# Patient Record
Sex: Male | Born: 1960 | Race: White | Hispanic: No | Marital: Married | Smoking: Never smoker
Health system: Southern US, Community
[De-identification: ages and names within clinical notes are randomized; demographics above are authoritative.]

## PROBLEM LIST (undated history)

## (undated) DIAGNOSIS — I10 Essential (primary) hypertension: Secondary | ICD-10-CM

## (undated) DIAGNOSIS — C61 Malignant neoplasm of prostate: Secondary | ICD-10-CM

## (undated) HISTORY — PX: PROSTATE BIOPSY: SHX241

## (undated) HISTORY — PX: COLONOSCOPY: SHX174

---

## 2004-11-07 ENCOUNTER — Encounter: Admission: RE | Admit: 2004-11-07 | Discharge: 2004-11-07 | Payer: Self-pay | Admitting: Geriatric Medicine

## 2004-11-17 ENCOUNTER — Encounter: Admission: RE | Admit: 2004-11-17 | Discharge: 2004-11-17 | Payer: Self-pay | Admitting: Geriatric Medicine

## 2004-11-19 ENCOUNTER — Encounter: Admission: RE | Admit: 2004-11-19 | Discharge: 2004-11-19 | Payer: Self-pay | Admitting: Geriatric Medicine

## 2004-11-26 ENCOUNTER — Ambulatory Visit (HOSPITAL_COMMUNITY): Admission: RE | Admit: 2004-11-26 | Discharge: 2004-11-26 | Payer: Self-pay | Admitting: Neurosurgery

## 2004-11-26 ENCOUNTER — Encounter (INDEPENDENT_AMBULATORY_CARE_PROVIDER_SITE_OTHER): Payer: Self-pay | Admitting: Specialist

## 2005-02-23 ENCOUNTER — Encounter: Admission: RE | Admit: 2005-02-23 | Discharge: 2005-02-23 | Payer: Self-pay | Admitting: Neurosurgery

## 2005-05-19 ENCOUNTER — Encounter: Admission: RE | Admit: 2005-05-19 | Discharge: 2005-05-19 | Payer: Self-pay | Admitting: Neurosurgery

## 2005-11-26 ENCOUNTER — Encounter: Admission: RE | Admit: 2005-11-26 | Discharge: 2005-11-26 | Payer: Self-pay | Admitting: Neurosurgery

## 2006-05-24 ENCOUNTER — Encounter: Admission: RE | Admit: 2006-05-24 | Discharge: 2006-05-24 | Payer: Self-pay | Admitting: Neurosurgery

## 2006-12-31 ENCOUNTER — Encounter: Admission: RE | Admit: 2006-12-31 | Discharge: 2006-12-31 | Payer: Self-pay | Admitting: Neurosurgery

## 2007-09-22 ENCOUNTER — Ambulatory Visit (HOSPITAL_COMMUNITY): Admission: RE | Admit: 2007-09-22 | Discharge: 2007-09-22 | Payer: Self-pay | Admitting: Geriatric Medicine

## 2007-11-29 ENCOUNTER — Encounter: Admission: RE | Admit: 2007-11-29 | Discharge: 2007-11-29 | Payer: Self-pay | Admitting: Neurosurgery

## 2008-04-12 ENCOUNTER — Ambulatory Visit (HOSPITAL_COMMUNITY): Admission: RE | Admit: 2008-04-12 | Discharge: 2008-04-12 | Payer: Self-pay | Admitting: Geriatric Medicine

## 2009-04-16 ENCOUNTER — Ambulatory Visit (HOSPITAL_COMMUNITY): Admission: RE | Admit: 2009-04-16 | Discharge: 2009-04-16 | Payer: Self-pay | Admitting: Geriatric Medicine

## 2010-04-08 ENCOUNTER — Other Ambulatory Visit: Payer: Self-pay | Admitting: Gastroenterology

## 2010-07-04 NOTE — H&P (Signed)
NAME:  Gordon Bailey, Gordon Bailey             ACCOUNT NO.:  [id number]   MEDICAL RECORD NO.:  [id number]          PATIENT TYPE:  AMB   LOCATION:  SDS                          FACILITY:  [facility]   PHYSICIAN:  Payton Doughty, M.D.      DATE OF BIRTH:  01/11/1961   DATE OF ADMISSION:  11/26/2004  DATE OF DISCHARGE:                                HISTORY & PHYSICAL   ADMITTING DIAGNOSIS:  Left skull lesion.   SERVICE:  Neurosurgery.   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 He has a lesion in his left frontal region. I saw him in my office. He  is here for a biopsy.   MEDICAL HISTORY:  Benign.   ALLERGIES:  None.   MEDICATIONS:  None.   SURGICAL HISTORY:  Basal cell carcinoma excised.   FAMILY HISTORY:  Mom is 28 with hypercholesterolemia and hypertension. Dad  died at 58 of lung cancer and hypertension.   SOCIAL HISTORY:  He does not smoke, he is a social drinker and radiologist.   REVIEW OF SYSTEMS:  Benign.   PHYSICAL EXAMINATION:  HEENT:  Within normal limits. He has a raised lesion  in the skull slightly tender and just left of the midline at the coronal  suture, probably straddling it. There is no erythema.  CARDIAC:  Unremarkable.  CHEST:  Unremarkable.  NEUROLOGIC:  He is intact.   CT and MR reveals a sclerotic lesion in the bone with lysis in the diploic  space with thickening of the underlying meninges that extends down the falx  __________ across the midline.   CLINICAL IMPRESSION:  Skull lesion. I think the dural reaction is secondary.   PLAN:  For biopsy of the skull lesion. It has been discussed with him and he  wished to proceed.           ______________________________  Payton Doughty, M.D.     MWR/MEDQ  D:  11/26/2004  T:  11/26/2004  Job:  [id number]

## 2010-07-04 NOTE — Op Note (Signed)
NAME:  PURVIS, SIDLE             ACCOUNT NO.:  [id number]   MEDICAL RECORD NO.:  [id number]          PATIENT TYPE:  AMB   LOCATION:  SDS                          FACILITY:  [facility]   PHYSICIAN:  Payton Doughty, M.D.      DATE OF BIRTH:  [birth year]   DATE OF PROCEDURE:  11/26/2004  DATE OF DISCHARGE:                                 OPERATIVE REPORT   PREOPERATIVE DIAGNOSIS:  Right frontal skull lesion.   POSTOPERATIVE DIAGNOSIS:  Right frontal skull lesion.   PROCEDURE:  Right frontal skull biopsy.   SURGEON:  Payton Doughty, M.D.   ANESTHESIA:  General endotracheal anesthesia.   PREPARATION:  Prepped with Betadine and scrubbed with alcohol wipe.   COMPLICATIONS:  None.   ASSISTANT:  Chief Strategy Officer, Basilia Jumbo.   INDICATIONS FOR PROCEDURE:  A 50 year old gentleman with a raised lesion in  the left frontal lesion of his skull.   DESCRIPTION OF PROCEDURE:  Taken to the operating room, smoothly  anesthetized and intubated.  Placed supine on the operating table.  Following shave, prep and drape in the usual sterile fashion, the skin was  infiltrated with 1% with 1:400,000 epinephrine directly over the lesion and  approximately 3 to 4 cm skin incision was made and the Weitlaner retractor  placed.  The periosteum was left intact using a 1 cm osteotome. A 1 cm  square block of bone was cut and using the curved osteotome, it was elevated  with the cortical bone attached.  The bone was quite thickened but was not  particularly bloody.  A second section slightly deeper was taken to a total  depth of approximately 4 to 5 mm.  Hemostasis was assured and successive  layers of 2-0 Vicryl and 3-0 nylon were used to close the galea and skin,  respectively.  Betadine and Telfa dressing was applied and the patient  returned to the recovery room in good condition.           ______________________________  Payton Doughty, M.D.     MWR/MEDQ  D:  11/26/2004  T:  11/26/2004  Job:  [id number]   cc:    Hal T. Stoneking, M.D.  Fax: [phone]

## 2010-11-26 ENCOUNTER — Other Ambulatory Visit: Payer: Self-pay | Admitting: Dermatology

## 2010-12-25 ENCOUNTER — Other Ambulatory Visit: Payer: Self-pay | Admitting: Neurosurgery

## 2010-12-25 DIAGNOSIS — D329 Benign neoplasm of meninges, unspecified: Secondary | ICD-10-CM

## 2011-01-16 ENCOUNTER — Other Ambulatory Visit: Payer: Self-pay

## 2011-01-28 ENCOUNTER — Ambulatory Visit
Admission: RE | Admit: 2011-01-28 | Discharge: 2011-01-28 | Disposition: A | Discharge status: Home / Self Care | Payer: PRIVATE HEALTH INSURANCE | Source: Ambulatory Visit | Attending: Neurosurgery | Admitting: Neurosurgery

## 2011-01-28 DIAGNOSIS — D329 Benign neoplasm of meninges, unspecified: Secondary | ICD-10-CM

## 2011-01-28 MED ORDER — GADOBENATE DIMEGLUMINE 529 MG/ML IV SOLN
15.0000 mL | Freq: Once | INTRAVENOUS | Status: AC | PRN
  Administered 2011-01-28: 15 mL via INTRAVENOUS

## 2011-06-10 ENCOUNTER — Other Ambulatory Visit: Payer: Self-pay | Admitting: Dermatology

## 2011-11-02 ENCOUNTER — Ambulatory Visit: Payer: PRIVATE HEALTH INSURANCE | Attending: Geriatric Medicine | Admitting: Audiology

## 2011-11-02 DIAGNOSIS — F802 Mixed receptive-expressive language disorder: Secondary | ICD-10-CM | POA: Insufficient documentation

## 2012-12-05 ENCOUNTER — Other Ambulatory Visit: Payer: Self-pay | Admitting: Dermatology

## 2013-06-15 ENCOUNTER — Other Ambulatory Visit: Payer: Self-pay | Admitting: Geriatric Medicine

## 2013-06-15 ENCOUNTER — Ambulatory Visit
Admission: RE | Admit: 2013-06-15 | Discharge: 2013-06-15 | Disposition: A | Discharge status: Home / Self Care | Payer: PRIVATE HEALTH INSURANCE | Source: Ambulatory Visit | Attending: Geriatric Medicine | Admitting: Geriatric Medicine

## 2013-06-15 DIAGNOSIS — M549 Dorsalgia, unspecified: Secondary | ICD-10-CM

## 2013-12-13 ENCOUNTER — Other Ambulatory Visit: Payer: Self-pay | Admitting: Dermatology

## 2014-06-20 ENCOUNTER — Other Ambulatory Visit: Payer: Self-pay | Admitting: Geriatric Medicine

## 2014-06-20 DIAGNOSIS — M545 Low back pain: Secondary | ICD-10-CM

## 2014-06-24 ENCOUNTER — Ambulatory Visit
Admission: RE | Admit: 2014-06-24 | Discharge: 2014-06-24 | Disposition: A | Discharge status: Home / Self Care | Payer: PRIVATE HEALTH INSURANCE | Source: Ambulatory Visit | Attending: Geriatric Medicine | Admitting: Geriatric Medicine

## 2014-06-24 DIAGNOSIS — M545 Low back pain: Secondary | ICD-10-CM

## 2014-06-25 ENCOUNTER — Ambulatory Visit
Admission: RE | Admit: 2014-06-25 | Discharge: 2014-06-25 | Disposition: A | Discharge status: Home / Self Care | Payer: PRIVATE HEALTH INSURANCE | Source: Ambulatory Visit | Attending: Geriatric Medicine | Admitting: Geriatric Medicine

## 2014-06-25 ENCOUNTER — Other Ambulatory Visit: Payer: Self-pay | Admitting: Geriatric Medicine

## 2014-06-25 DIAGNOSIS — M545 Low back pain, unspecified: Secondary | ICD-10-CM

## 2014-06-25 DIAGNOSIS — G8929 Other chronic pain: Secondary | ICD-10-CM

## 2014-06-25 MED ORDER — IOHEXOL 180 MG/ML  SOLN
1.0000 mL | Freq: Once | INTRAMUSCULAR | Status: AC | PRN
  Administered 2014-06-25: 1 mL via EPIDURAL

## 2014-06-25 MED ORDER — METHYLPREDNISOLONE ACETATE 40 MG/ML INJ SUSP (RADIOLOG
120.0000 mg | Freq: Once | INTRAMUSCULAR | Status: AC
  Administered 2014-06-25: 120 mg via EPIDURAL

## 2014-06-25 NOTE — Discharge Instructions (Signed)

## 2014-07-02 ENCOUNTER — Other Ambulatory Visit: Payer: Self-pay | Admitting: Geriatric Medicine

## 2014-07-02 DIAGNOSIS — M545 Low back pain, unspecified: Secondary | ICD-10-CM

## 2014-07-02 DIAGNOSIS — G8929 Other chronic pain: Secondary | ICD-10-CM

## 2014-07-10 ENCOUNTER — Ambulatory Visit
Admission: RE | Admit: 2014-07-10 | Discharge: 2014-07-10 | Disposition: A | Discharge status: Home / Self Care | Payer: PRIVATE HEALTH INSURANCE | Source: Ambulatory Visit | Attending: Geriatric Medicine | Admitting: Geriatric Medicine

## 2014-07-10 DIAGNOSIS — G8929 Other chronic pain: Secondary | ICD-10-CM

## 2014-07-10 DIAGNOSIS — M545 Low back pain: Principal | ICD-10-CM

## 2014-07-10 MED ORDER — METHYLPREDNISOLONE ACETATE 40 MG/ML INJ SUSP (RADIOLOG
120.0000 mg | Freq: Once | INTRAMUSCULAR | Status: AC
  Administered 2014-07-10: 120 mg via EPIDURAL

## 2014-07-10 MED ORDER — IOHEXOL 180 MG/ML  SOLN
1.0000 mL | Freq: Once | INTRAMUSCULAR | Status: AC | PRN
  Administered 2014-07-10: 1 mL via EPIDURAL

## 2015-01-03 ENCOUNTER — Ambulatory Visit
Admission: RE | Admit: 2015-01-03 | Discharge: 2015-01-03 | Disposition: A | Discharge status: Home / Self Care | Payer: PRIVATE HEALTH INSURANCE | Source: Ambulatory Visit | Attending: Geriatric Medicine | Admitting: Geriatric Medicine

## 2015-01-03 ENCOUNTER — Other Ambulatory Visit: Payer: Self-pay | Admitting: Geriatric Medicine

## 2015-01-03 DIAGNOSIS — M25512 Pain in left shoulder: Secondary | ICD-10-CM

## 2015-06-25 ENCOUNTER — Other Ambulatory Visit: Payer: Self-pay | Admitting: Gastroenterology

## 2017-06-15 ENCOUNTER — Telehealth: Payer: Self-pay | Admitting: Medical Oncology

## 2017-06-15 NOTE — Telephone Encounter (Signed)
Left a voicemail requesting a return call regarding referral to the [facility].

## 2017-06-15 NOTE — Telephone Encounter (Signed)
Dr. Purcell Nails returned call and I introduced myself as the Prostate Nurse Navigator and the Coordinator of the [facility] [place].  1. I confirmed with the patient he is aware of his referral to the clinic 06/25/17 arriving at 8:30 am.  2. I discussed the format of the clinic and the physicians he will be seeing that day.  3. I discussed where the clinic is located and how to contact me.  4. I confirmed his address and informed him I would be mailing a packet of information and forms to be completed. I asked him to bring them with him the day of his appointment.   He voiced understanding of the above. I asked him to call me if he has any questions or concerns regarding his appointments or the forms he needs to complete.

## 2017-06-24 ENCOUNTER — Telehealth: Payer: Self-pay | Admitting: Medical Oncology

## 2017-06-24 NOTE — Progress Notes (Signed)
GU Location of Tumor / Histology: prostatic adenocarcinoma  If Prostate Cancer, Gleason Score is (3+4) and PSA is (4.61). 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 Alinda Money by Dr. Felipa Eth for further evaluation of a rising PSA.   Biopsies of prostate (if applicable) revealed:    Past/Anticipated interventions by urology, if any: prostate biopsy, referral to [facility]  Past/Anticipated interventions by medical oncology, if any: no  Weight changes, if any: no  Bowel/Bladder complaints, if any: IPSS 4.   Nausea/Vomiting, if any: no  Pain issues, if any:  no  SAFETY ISSUES:  Prior radiation? no  Pacemaker/ICD? no  Possible current pregnancy? no  Is the patient on methotrexate? no  Current Complaints / other details:  57 year old male. Married with three daughters. Father with hx of lung ca.

## 2017-06-24 NOTE — Telephone Encounter (Signed)
Called patient and spoke with his wife to confirm appointment for [facility] arriving at 8:00am. I reviewed registration and reminded them to bring completed medical forms. She voiced understanding.

## 2017-06-25 ENCOUNTER — Inpatient Hospital Stay: Payer: PRIVATE HEALTH INSURANCE | Attending: Oncology | Admitting: Oncology

## 2017-06-25 ENCOUNTER — Encounter: Payer: Self-pay | Admitting: General Practice

## 2017-06-25 ENCOUNTER — Encounter: Payer: Self-pay | Admitting: Radiation Oncology

## 2017-06-25 ENCOUNTER — Ambulatory Visit
Admission: RE | Admit: 2017-06-25 | Discharge: 2017-06-25 | Disposition: A | Discharge status: Home / Self Care | Payer: PRIVATE HEALTH INSURANCE | Source: Ambulatory Visit | Attending: Radiation Oncology | Admitting: Radiation Oncology

## 2017-06-25 ENCOUNTER — Other Ambulatory Visit: Payer: Self-pay

## 2017-06-25 ENCOUNTER — Encounter: Payer: Self-pay | Admitting: Medical Oncology

## 2017-06-25 VITALS — BP 123/72 | HR 91 | Temp 98.2°F | Resp 20 | Ht 68.0 in | Wt 167.0 lb

## 2017-06-25 DIAGNOSIS — Z801 Family history of malignant neoplasm of trachea, bronchus and lung: Secondary | ICD-10-CM | POA: Diagnosis not present

## 2017-06-25 DIAGNOSIS — Z806 Family history of leukemia: Secondary | ICD-10-CM | POA: Diagnosis not present

## 2017-06-25 DIAGNOSIS — E785 Hyperlipidemia, unspecified: Secondary | ICD-10-CM | POA: Insufficient documentation

## 2017-06-25 DIAGNOSIS — R972 Elevated prostate specific antigen [PSA]: Secondary | ICD-10-CM | POA: Diagnosis not present

## 2017-06-25 DIAGNOSIS — Z79899 Other long term (current) drug therapy: Secondary | ICD-10-CM | POA: Insufficient documentation

## 2017-06-25 DIAGNOSIS — Z800 Family history of malignant neoplasm of digestive organs: Secondary | ICD-10-CM | POA: Diagnosis not present

## 2017-06-25 DIAGNOSIS — I10 Essential (primary) hypertension: Secondary | ICD-10-CM | POA: Insufficient documentation

## 2017-06-25 DIAGNOSIS — C61 Malignant neoplasm of prostate: Secondary | ICD-10-CM | POA: Diagnosis present

## 2017-06-25 HISTORY — DX: Malignant neoplasm of prostate: C61

## 2017-06-25 NOTE — Progress Notes (Signed)
                               Care Plan Summary  Name: Gordon Bailey DOB: 1960/07/12   Your Medical Team:   Urologist -  Dr. Raynelle Bring, [facility]  Radiation Oncologist - Dr. Tyler Pita, [facility]   Medical Oncologist - Dr. Zola Button, [place]  Recommendations: 1) Robotic prostatectomy 2) Radiation  3) Brachytherapy  * These recommendations are based on information available as of today's consult.      Recommendations may change depending on the results of further tests or exams.  Next Steps: 1) Dr. Lynne Logan office will schedule surgery   When appointments need to be scheduled, you will be contacted by [facility] and/or [facility].  Patient provided with business cards for all team members and a copy of "Fall Prevention Patient Safety Sheet". Questions?  Please do not hesitate to call Cira Rue, RN, BSN, OCN at [phone] any questions or concerns.  Gordon Bailey is your Oncology Nurse Navigator and is available to assist you while you're receiving your medical care at [facility].

## 2017-06-25 NOTE — Progress Notes (Signed)
Radiation Oncology         [phone] ________________________________  Initial outpatient Consultation  Name: Gordon Bailey MRN: [id number]  Date: 06/25/2017  DOB: 1960/10/06  LH:[id number], No Pcp Per  Raynelle Bring, MD   REFERRING PHYSICIAN: Raynelle Bring, MD  DIAGNOSIS: 57 year-old gentleman with Stage T1c adenocarcinoma of the prostate with Gleason Score of 3+4, and PSA of 4.61.    ICD-10-CM   1. Malignant neoplasm of prostate ([place]) C61     HISTORY OF PRESENT ILLNESS: Gordon Bailey is a 57 y.o. male with a diagnosis of prostate cancer. He was noted to have a gradual rise in his PSA since 2014.  Recently, his PSA was noted further elevated at 4.61 by his primary care physician, Dr. Felipa Eth.  Accordingly, he was referred for evaluation in urology by Dr. Alinda Money on 04/28/2017,  digital rectal examination was performed at that time revealing no nodularity.  The patient proceeded to transrectal ultrasound with 12 biopsies of the prostate on 05/25/2017.  The prostate volume measured 25.7 cc.  Out of 12 core biopsies, 3 were positive.  The maximum Gleason score was 3+4, and this was seen in the right base lateral. Additionally, there was Gleason 3+3 in the left base lateral and right mid lateral.  The patient reviewed the biopsy results with his urologist and he has kindly been referred today for discussion of potential radiation treatment options.      PREVIOUS RADIATION THERAPY: No  PAST MEDICAL HISTORY:  Past Medical History:  Diagnosis Date  . Prostate cancer ([place])       PAST SURGICAL HISTORY: Past Surgical History:  Procedure Laterality Date  . PROSTATE BIOPSY      FAMILY HISTORY:  Family History  Problem Relation Age of Onset  . Lung cancer Father   . Pancreatic cancer Maternal Aunt   . Leukemia Maternal Uncle   . Head & neck cancer Maternal Grandfather     SOCIAL HISTORY:  Social History   Socioeconomic History  . Marital status: Married    Spouse  name: Not on file  . Number of children: Not on file  . Years of education: Not on file  . Highest education level: Not on file  Occupational History  . Not on file  Social Needs  . Financial resource strain: Not on file  . Food insecurity:    Worry: Not on file    Inability: Not on file  . Transportation needs:    Medical: Not on file    Non-medical: Not on file  Tobacco Use  . Smoking status: Never Smoker  . Smokeless tobacco: Never Used  Substance and Sexual Activity  . Alcohol use: Yes    Alcohol/week: 3.0 oz    Types: 5 Standard drinks or equivalent per week  . Drug use: Not Currently  . Sexual activity: Yes  Lifestyle  . Physical activity:    Days per week: Not on file    Minutes per session: Not on file  . Stress: Not on file  Relationships  . Social connections:    Talks on phone: Not on file    Gets together: Not on file    Attends religious service: Not on file    Active member of club or organization: Not on file    Attends meetings of clubs or organizations: Not on file    Relationship status: Not on file  . Intimate partner violence:    Fear of current or ex partner: Not on  file    Emotionally abused: Not on file    Physically abused: Not on file    Forced sexual activity: Not on file  Other Topics Concern  . Not on file  Social History Narrative  . Not on file    ALLERGIES: Patient has no known allergies.  MEDICATIONS:  Current Outpatient Medications  Medication Sig Dispense Refill  . amLODipine (NORVASC) 2.5 MG tablet     . amphetamine-dextroamphetamine (ADDERALL) 10 MG tablet     . benazepril (LOTENSIN) 20 MG tablet     . simvastatin (ZOCOR) 10 MG tablet      No current facility-administered medications for this encounter.     REVIEW OF SYSTEMS:  On review of systems, the patient reports that he is doing well overall. He denies any chest pain, shortness of breath, cough, fevers, chills, night sweats, unintended weight changes. He denies any  bowel disturbances, and denies abdominal pain, nausea or vomiting. He denies any new musculoskeletal or joint aches or pains. Denies any feet or ankle swelling. His IPSS was 4, indicating mild urinary symptoms. He is able to complete sexual activity with all attempts. A complete review of systems is obtained and is otherwise negative.    PHYSICAL EXAM:  Wt Readings from Last 3 Encounters:  06/25/17 167 lb (75.8 kg)   Temp Readings from Last 3 Encounters:  06/25/17 98.2 F (36.8 C) (Oral)   BP Readings from Last 3 Encounters:  06/25/17 123/72  07/10/14 (!) 148/80  06/25/14 136/65   Pulse Readings from Last 3 Encounters:  06/25/17 91  07/10/14 76  06/25/14 63   Pain Assessment Pain Score: 0-No pain/10  In general this is a well appearing caucasian male in no acute distress. The patient is accompanied by his wife today. He is alert and oriented x4 and appropriate throughout the examination. HEENT reveals that the patient is normocephalic, atraumatic. EOMs are intact. PERRLA. Skin is intact without any evidence of gross lesions. Cardiovascular exam reveals a regular rate and rhythm, no clicks rubs or murmurs are auscultated. Chest is clear to auscultation bilaterally. Lymphatic assessment is performed and does not reveal any adenopathy in the cervical, supraclavicular, axillary, or inguinal chains. Abdomen has active bowel sounds in all quadrants and is intact. The abdomen is soft, non tender, non distended. Lower extremities are negative for pretibial pitting edema, deep calf tenderness, cyanosis or clubbing.   KPS = 100  100 - Normal; no complaints; no evidence of disease. 90   - Able to carry on normal activity; minor signs or symptoms of disease. 80   - Normal activity with effort; some signs or symptoms of disease. 22   - Cares for self; unable to carry on normal activity or to do active work. 60   - Requires occasional assistance, but is able to care for most of his personal  needs. 50   - Requires considerable assistance and frequent medical care. 34   - Disabled; requires special care and assistance. 56   - Severely disabled; hospital admission is indicated although death not imminent. 2   - Very sick; hospital admission necessary; active supportive treatment necessary. 10   - Moribund; fatal processes progressing rapidly. 0     - Dead  Karnofsky DA, Abelmann WH, Craver LS and Burchenal JH [phone]) The use of the nitrogen mustards in the palliative treatment of carcinoma: with particular reference to bronchogenic carcinoma Cancer 1 [id number]  LABORATORY DATA:  No results found for: WBC, HGB, HCT, MCV,  PLT No results found for: NA, K, CL, CO2 No results found for: ALT, AST, GGT, ALKPHOS, BILITOT   RADIOGRAPHY: No results found.    IMPRESSION/PLAN: 1. 57 y.o. gentleman with Stage T1c adenocarcinoma of the prostate with Gleason Score of 3+4, and PSA of 4.61.  Today we reviewed the findings and workup thus far.  We discussed the natural history of prostate cancer.  We reviewed the the implications of T-stage, Gleason's Score, and PSA on decision-making and outcomes in prostate cancer.  We discussed radiation treatment in the management of prostate cancer with regard to the logistics and delivery of external beam radiation treatment as well as the logistics and delivery of prostate brachytherapy.  We compared and contrasted each of these approaches and also compared these against prostatectomy.   At the conclusion of our conversation, the patient elects to proceed with prostatectomy.  We will share our findings with Dr. Alinda Money. We would be happy to continue to participate in his care if clinically indicated in the future.    Nicholos Johns, PA-C    Tyler Pita, MD  [facility] Direct Dial: [phone]  Fax: [phone] [email or web address]  Skype  [email or web address]  This document serves as a record of services personally performed by Tyler Pita, MD and Freeman Caldron, PA-C. It was created on their behalf by Arlyce Harman, a trained medical scribe. The creation of this record is based on the scribe's personal observations and the provider's statements to them. This document has been checked and approved by the attending provider.

## 2017-06-25 NOTE — Progress Notes (Signed)
Reason for Referral: Prostate cancer  HPI: 57 year old gentleman currently of [place] where he lived at least for the last 20 years or so.  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  He has history of hypertension and hyperlipidemia is reasonably controlled.  He also had history of meningioma that has been biopsied and observed for a period of time.  He was found to have an elevated PSA on routine physical examination of 4.61  He subsequently underwent a biopsy by Dr. Alinda Money on 05/25/2017 which showed prostate cancer in 3 cores.  He had a Gleason score 3+4 = 7 and 40% of the core as well as 2 cores of Gleason score 3+3 = 6.  He is completely asymptomatic from these findings and denies any hematuria, dysuria or frequency.  He does not report any headaches, blurry vision, syncope or seizures. Does not report any fevers, chills or sweats.  Does not report any cough, wheezing or hemoptysis.  Does not report any chest pain, palpitation, orthopnea or leg edema.  Does not report any nausea, vomiting or abdominal pain.  Does not report any constipation or diarrhea.  Does not report any skeletal complaints.      Does not report any skin rashes or lesions. Does not report any heat or cold intolerance.  Does not report any lymphadenopathy or petechiae.  Does not report any anxiety or depression.  Remaining review of systems is negative.    His past medical history was reviewed above which includes hypertension and hyperlipidemia.  His medication reviewed today including amlodipine, Benzapril, simvastatin and Adderall.  He also takes vitamin D, multivitamin, fish oil and aspirin.  He has no known drug allergies.    Social History   Socioeconomic History  . Marital status: Married    Spouse name: Not on file  . Number of children: Not on file  . Years of education: Not on file  . Highest education level: Not on file  Occupational History  . Not on file  Social Needs  . Financial resource  strain: Not on file  . Food insecurity:    Worry: Not on file    Inability: Not on file  . Transportation needs:    Medical: Not on file    Non-medical: Not on file  Tobacco Use  . Smoking status: Never Smoker  Substance and Sexual Activity  . Alcohol use: Not on file  . Drug use: Not on file  . Sexual activity: Not on file  Lifestyle  . Physical activity:    Days per week: Not on file    Minutes per session: Not on file  . Stress: Not on file  Relationships  . Social connections:    Talks on phone: Not on file    Gets together: Not on file    Attends religious service: Not on file    Active member of club or organization: Not on file    Attends meetings of clubs or organizations: Not on file    Relationship status: Not on file  . Intimate partner violence:    Fear of current or ex partner: Not on file    Emotionally abused: Not on file    Physically abused: Not on file    Forced sexual activity: Not on file  Other Topics Concern  . Not on file  Social History Narrative  . Not on file  :   Exam:   General appearance: alert and cooperative appeared without distress. ECOG 0  CBC No results  found for: WBC, RBC, HGB, HCT, PLT, MCV, MCH, MCHC, RDW, LYMPHSABS, MONOABS, EOSABS, BASOSABS   Clinical Data:  Skull lesion.  NM WHOLE BODY BONE SCAN: Technique:  Whole body anterior and posterior images were obtained approximately 3 hours after intravenous injection of radiopharmaceutical.  In addition, images focused over the skull were obtained.   Radiopharmaceutical:  26.0 mCi Tc-16m MDP. Findings:  There is an oval area of markedly increased activity within the left calvarium near the midline and just anterior to the vertex.  The remainder of the activity throughout the skeleton is normal.  In correlation with CT and MR the primary considerations are that of Paget's disease or fibrous dysplasia versus bony remodeling secondary to possible adjacent en plaque meningioma.   Infection is less likely in view of the clinical presentation.   IMPRESSION: Intense focus of increased activity in the left calvarium near the midline.  Primary considerations are that of Paget's disease, fibrous dysplasia versus hyperostosis secondary to adjacent en plaque meningioma with infection less likely.   EXAM: MRI LUMBAR SPINE WITHOUT CONTRAST  TECHNIQUE: Multiplanar, multisequence MR imaging of the lumbar spine was performed. No intravenous contrast was administered.  COMPARISON:  Lumbar radiographs 06/15/2013.  FINDINGS: Mild dextro convex thoracolumbar scoliosis with normal lumbar segmentation demonstrated on the comparison. Stable vertebral height and alignment. No marrow edema or evidence of acute osseous abnormality.  Visualized lower thoracic spinal cord is normal with conus medularis at T12-L1.  Visualized abdominal viscera and paraspinal soft tissues are within normal limits.  T11-T12: Mild to moderate facet hypertrophy. Borderline to mild T11 foraminal stenosis.  T12-L1:  Negative.  L1-L2:  Negative.  L2-L3:  Negative.  L3-L4:  Minimal disc bulge.  Mild facet hypertrophy.  No stenosis.  L4-L5: Mild chronic posterior superior L5 endplate degeneration. Mild disc space loss and mild to moderate circumferential disc bulging. Superimposed lobulated posterior disc herniation with a left paracentral component with annular fissure (series 7, image 23) and a broad-based right foraminal component. Superimposed mild facet and ligament flavum hypertrophy. Severe left lateral recess stenosis. Mild overall spinal stenosis. Mild to moderate right lateral recess stenosis and right L4 foraminal stenosis. No L4 foraminal involvement.  L5-S1: Mild far lateral disc osteophyte complex. Small right lateral recess annular fissure (series 7, image 30). Borderline to mild facet hypertrophy. No stenosis.  IMPRESSION: 1. L4-L5 disc degeneration with left  paracentral and broad-based right subarticular/foraminal disc herniation components. Subsequent severe left lateral recess stenosis, favored to be the symptomatic abnormality. Mild overall spinal stenosis and mild to moderate right lateral recess and right L4 foraminal stenosis. 2. Minimal to mi disc degeneration at L3-L4 and L5-S1 without stenosis.    Assessment and Plan:   57 year old gentleman with prostate cancer diagnosed in April 2019.  His PSA was 4.61 and a Gleason score of 3+4 = 7 and one core and 2 additional cores of 3+3 = 6.   He is case was discussed today in the prostate cancer multidisciplinary clinic including discussion with the reviewing pathologist.  Options of therapy were reviewed today with the patient and his wife.  His options would include active surveillance, primary surgical therapy versus radiation and short course of androgen deprivation.  The rationale for using those options as well as complication associated with those therapies were reviewed today.  The preferential approach of primary surgical therapy my offer advantage of obtaining more adequate staging which potentially opens the possibility of additional therapy such as radiation and androgen deprivation in the future to  be done adjuvantly or in the salvage fashion.  Salvage prostatectomy after radiation would be an option but would be a more difficult option at the time.  After discussion today he is leaning towards surgery and will have a conversation with Dr. Alinda Money and Dr. Tammi Klippel regarding those options.  10  minutes was spent with the patient face-to-face today.  More than 50% of time was dedicated to patient counseling, education and answering questions regarding future plan of care.

## 2017-06-25 NOTE — Consult Note (Signed)
Gordon Bailey     06/25/2017   --------------------------------------------------------------------------------   Gordon Bailey  MRN: [id number]  PRIMARY CARE:    DOB: 06-02-1960, 57 year old Male  REFERRING:  Gordon T. Felipa Eth, MD  SSN: [id number]  PROVIDER:  Raynelle Bailey, M.D.    LOCATION:  [facility] [phone]   --------------------------------------------------------------------------------   CC/HPI: CC: Prostate Cancer   PCP: Dr. 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 He underwent a TRUS biopsy of the prostate on 05/25/17 that confirmed Gleason 3+4=7 adenocarcinoma of the prostate with 3 out of 12 biopsy cores positive for malignancy.   Family history: None.   Imaging studies: None.   PMH: He has a history of hyperlipidemia and hypertension.  PSH: No abdominal surgeries.   TNM stage: cT1c Nx Mx  PSA: 4.61  Gleason score: 3+4=7  Biopsy (05/25/17): 3/12 cores positive  Left: L lateral base (30%, 3+3=6)  Right: R lateral mid (< 5%, 3+3=6), R lateral base (40%, 3+4=7)  Prostate volume: 25.7 cc   Nomogram  OC disease: 52%  EPE: 47%  SVI: 2%  LNI: 2%  PFS (5 year, 10 year): 89%, 81%   Urinary function: IPSS is 8.  Erectile function: SHIM score is 25.     ALLERGIES: No Allergies    MEDICATIONS: Aspirin 81 mg tablet,chewable  Simvastatin  Adderall  Amlodipine Besilate  Benazepril Hcl  Fluticasone Propionate  Multiple Vitamin     GU PSH: Prostate Needle Biopsy - 05/25/2017    NON-GU PSH: Surgical Pathology, Gross And Microscopic Examination For Prostate Needle - 05/25/2017    GU PMH: No GU PMH    NON-GU PMH: Hypercholesterolemia Hypertension    FAMILY HISTORY: 3 daughters - Runs in Family Lung Cancer - Father   SOCIAL HISTORY: Marital Status: Married Preferred Language:  English; Ethnicity: Not Hispanic Or Latino; Race: White Current Smoking Status: Patient has never smoked.   Tobacco Use Assessment Completed: Used Tobacco in last 30 days? Does drink.  Drinks 1 caffeinated drink per day.    REVIEW OF SYSTEMS:    GU Review Male:   Patient denies frequent urination, hard to postpone urination, burning/ pain with urination, get up at night to urinate, leakage of urine, stream starts and stops, trouble starting your streams, and have to strain to urinate .  Gastrointestinal (Lower):   Patient denies diarrhea and constipation.  Gastrointestinal (Upper):   Patient denies nausea and vomiting.  Constitutional:   Patient denies fever, night sweats, weight loss, and fatigue.  Skin:   Patient denies skin rash/ lesion and itching.  Eyes:   Patient denies blurred vision and double vision.  Ears/ Nose/ Throat:   Patient denies sore throat and sinus problems.  Hematologic/Lymphatic:   Patient denies swollen glands and easy bruising.  Cardiovascular:   Patient denies leg swelling and chest pains.  Respiratory:   Patient denies cough and shortness of breath.  Endocrine:   Patient denies excessive thirst.  Musculoskeletal:   Patient denies back pain and joint pain.  Neurological:   Patient denies headaches and dizziness.  Psychologic:   Patient denies depression and anxiety.   VITAL SIGNS: None   MULTI-SYSTEM PHYSICAL EXAMINATION:    Constitutional: Well-nourished. No physical deformities. Normally developed. Good grooming.  Respiratory: No labored breathing, no use of accessory muscles. Normal breath sounds.  Cardiovascular:  Normal temperature, normal extremity pulses, no swelling, no varicosities.      PAST DATA REVIEWED:  Source Of History:  Patient  Lab Test Review:   PSA  Records Review:   Pathology Reports, Previous Patient Records   PROCEDURES: None   ASSESSMENT:      ICD-10 Details  1 GU:   Prostate Cancer - C61    PLAN:            Document Letter(s):  Created for Patient: Clinical Summary         Notes:   1. Prostate cancer: I had a detailed discussion with Gordon Bailey and April today regarding his recent prostate cancer diagnosis. The patient was counseled about the natural history of prostate cancer and the standard treatment options that are available for prostate cancer. It was explained to him how his age and life expectancy, clinical stage, Gleason score, and PSA affect his prognosis, the decision to proceed with additional staging studies, as well as how that information influences recommended treatment strategies. We discussed the roles for active surveillance, radiation therapy, surgical therapy, androgen deprivation, as well as ablative therapy options for the treatment of prostate cancer as appropriate to his individual cancer situation. We discussed the risks and benefits of these options with regard to their impact on cancer control and also in terms of potential adverse events, complications, and impact on quality of life particularly related to urinary and sexual function. The patient was encouraged to ask questions throughout the discussion today and all questions were answered to his stated satisfaction. In addition, the patient was provided with and/or directed to appropriate resources and literature for further education about prostate cancer and treatment options.   We discussed surgical therapy for prostate cancer including the different available surgical approaches. We discussed, in detail, the risks and expectations of surgery with regard to cancer control, urinary control, and erectile function as well as the expected postoperative recovery process. Additional risks of surgery including but not limited to bleeding, infection, hernia formation, nerve damage, lymphocele formation, bowel/rectal injury potentially necessitating colostomy, damage to the urinary tract resulting in urine leakage, urethral stricture, and the  cardiopulmonary risks such as myocardial infarction, stroke, death, venothromboembolism, etc. were explained. The risk of open surgical conversion for robotic/laparoscopic prostatectomy was also discussed.   They have met with Dr. Tammi Bailey and Dr. Alen Bailey earlier this morning. Gordon Bailey has made a decision that he would like to proceed with surgical therapy. He will be scheduled for a bilateral nerve-sparing robot assisted laparoscopic radical prostatectomy and bilateral pelvic lymphadenectomy.   Cc: Dr. Lajean Bailey     E & M CODE: I spent at least 42 minutes face to face with the patient, more than 50% of that time was spent on counseling and/or coordinating care.

## 2017-06-28 NOTE — Progress Notes (Signed)
[place] Psychosocial Distress Screening Spiritual Care  Met with Gordon Bailey and wife April and in [name] to introduce [name] team/resources, reviewing distress screen per protocol.  The patient scored a 3 on the Psychosocial Distress Thermometer which indicates mild distress. Also assessed for distress and other psychosocial needs.     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 At this time they desire privacy and are being selective about with whom they share news of dx. Normalized desire to be a patient, rather than to share information widely in medical (colleagial) community. Couple has twins who are seniors in college and a child who is a Conservation officer, nature in high school. Family enjoys traveling for pleasure, meaning-making, and stress relief.  Encouraged [name] programming for pt, spouse, and adult children as desired, particularly for the benefit of being among patients as peer support.    Follow up needed: No. Couple plans to reach out to Spiritual Care/Support Team as desired, but please also page if immediate needs arise or circumstances change. Thank you.   [name], [name], [facility] Pager [phone] Voicemail [phone]

## 2017-07-01 ENCOUNTER — Other Ambulatory Visit: Payer: Self-pay | Admitting: Urology

## 2017-07-26 NOTE — Patient Instructions (Addendum)
Gordon Portela, MD  07/26/2017   Your procedure is scheduled on: 08-02-17   Report to [facility] Main  Entrance    Report to Admitting at 9:30 AM    Call this number if you have problems the morning of surgery [phone]   Remember: Do not eat food or drink liquids :After Midnight.     Take these medicines the morning of surgery with A SIP OF WATER: Amlodipine (Norvasc), and Amphetamine-Dextroamphetamine (Adderall). You may also bring and use your eyedrops and nasal spray as needed.                                You may not have any metal on your body including hair pins and              piercings  Do not wear jewelry, lotions, powders or deodorant             Men may shave face and neck.   Do not bring valuables to the hospital. [place].  Contacts, dentures or bridgework may not be worn into surgery.  Leave suitcase in the car. After surgery it may be brought to your room.      Special Instructions: Please follow your prep per your surgeon's instructions              Please read over the following fact sheets you were given: _____________________________________________________________________          [facility] - Preparing for Surgery Before surgery, you can play an important role.  Because skin is not sterile, your skin needs to be as free of germs as possible.  You can reduce the number of germs on your skin by washing with CHG (chlorahexidine gluconate) soap before surgery.  CHG is an antiseptic cleaner which kills germs and bonds with the skin to continue killing germs even after washing. Please DO NOT use if you have an allergy to CHG or antibacterial soaps.  If your skin becomes reddened/irritated stop using the CHG and inform your nurse when you arrive at Short Stay. Do not shave (including legs and underarms) for at least 48 hours prior to the first CHG shower.  You may shave your  face/neck. Please follow these instructions carefully:  1.  Shower with CHG Soap the night before surgery and the  morning of Surgery.  2.  If you choose to wash your hair, wash your hair first as usual with your  normal  shampoo.  3.  After you shampoo, rinse your hair and body thoroughly to remove the  shampoo.                           4.  Use CHG as you would any other liquid soap.  You can apply chg directly  to the skin and wash                       Gently with a scrungie or clean washcloth.  5.  Apply the CHG Soap to your body ONLY FROM THE NECK DOWN.   Do not use on face/ open  Wound or open sores. Avoid contact with eyes, ears mouth and genitals (private parts).                       Wash face,  Genitals (private parts) with your normal soap.             6.  Wash thoroughly, paying special attention to the area where your surgery  will be performed.  7.  Thoroughly rinse your body with warm water from the neck down.  8.  DO NOT shower/wash with your normal soap after using and rinsing off  the CHG Soap.                9.  Pat yourself dry with a clean towel.            10.  Wear clean pajamas.            11.  Place clean sheets on your bed the night of your first shower and do not  sleep with pets. Day of Surgery : Do not apply any lotions/deodorants the morning of surgery.  Please wear clean clothes to the hospital/surgery center.  FAILURE TO FOLLOW THESE INSTRUCTIONS MAY RESULT IN THE CANCELLATION OF YOUR SURGERY PATIENT SIGNATURE_________________________________  NURSE SIGNATURE__________________________________  ________________________________________________________________________   Gordon Bailey  An incentive spirometer is a tool that can help keep your lungs clear and active. This tool measures how well you are filling your lungs with each breath. Taking long deep breaths may help reverse or decrease the chance of developing breathing  (pulmonary) problems (especially infection) following:  A long period of time when you are unable to move or be active. BEFORE THE PROCEDURE   If the spirometer includes an indicator to show your best effort, your nurse or respiratory therapist will set it to a desired goal.  If possible, sit up straight or lean slightly forward. Try not to slouch.  Hold the incentive spirometer in an upright position. INSTRUCTIONS FOR USE  1. Sit on the edge of your bed if possible, or sit up as far as you can in bed or on a chair. 2. Hold the incentive spirometer in an upright position. 3. Breathe out normally. 4. Place the mouthpiece in your mouth and seal your lips tightly around it. 5. Breathe in slowly and as deeply as possible, raising the piston or the ball toward the top of the column. 6. Hold your breath for 3-5 seconds or for as long as possible. Allow the piston or ball to fall to the bottom of the column. 7. Remove the mouthpiece from your mouth and breathe out normally. 8. Rest for a few seconds and repeat Steps 1 through 7 at least 10 times every 1-2 hours when you are awake. Take your time and take a few normal breaths between deep breaths. 9. The spirometer may include an indicator to show your best effort. Use the indicator as a goal to work toward during each repetition. 10. After each set of 10 deep breaths, practice coughing to be sure your lungs are clear. If you have an incision (the cut made at the time of surgery), support your incision when coughing by placing a pillow or rolled up towels firmly against it. Once you are able to get out of bed, walk around indoors and cough well. You may stop using the incentive spirometer when instructed by your caregiver.  RISKS AND COMPLICATIONS  Take your time so you do not get  dizzy or light-headed.  If you are in pain, you may need to take or ask for pain medication before doing incentive spirometry. It is harder to take a deep breath if you  are having pain. AFTER USE  Rest and breathe slowly and easily.  It can be helpful to keep track of a log of your progress. Your caregiver can provide you with a simple table to help with this. If you are using the spirometer at home, follow these instructions: [place] IF:   You are having difficultly using the spirometer.  You have trouble using the spirometer as often as instructed.  Your pain medication is not giving enough relief while using the spirometer.  You develop fever of 100.5 F (38.1 C) or higher. SEEK IMMEDIATE MEDICAL CARE IF:   You cough up bloody sputum that had not been present before.  You develop fever of 102 F (38.9 C) or greater.  You develop worsening pain at or near the incision site. MAKE SURE YOU:   Understand these instructions.  Will watch your condition.  Will get help right away if you are not doing well or get worse. Document Released: 06/15/2006 Document Revised: 04/27/2011 Document Reviewed: 08/16/2006 ExitCare Patient Information 2014 ExitCare, [place].   ________________________________________________________________________  WHAT IS A BLOOD TRANSFUSION? Blood Transfusion Information  A transfusion is the replacement of blood or some of its parts. Blood is made up of multiple cells which provide different functions.  Red blood cells carry oxygen and are used for blood loss replacement.  White blood cells fight against infection.  Platelets control bleeding.  Plasma helps clot blood.  Other blood products are available for specialized needs, such as hemophilia or other clotting disorders. BEFORE THE TRANSFUSION  Who gives blood for transfusions?   Healthy volunteers who are fully evaluated to make sure their blood is safe. This is blood bank blood. Transfusion therapy is the safest it has ever been in the practice of medicine. Before blood is taken from a donor, a complete history is taken to make sure that person has  no history of diseases nor engages in risky social behavior (examples are intravenous drug use or sexual activity with multiple partners). The donor's travel history is screened to minimize risk of transmitting infections, such as malaria. The donated blood is tested for signs of infectious diseases, such as HIV and hepatitis. The blood is then tested to be sure it is compatible with you in order to minimize the chance of a transfusion reaction. If you or a relative donates blood, this is often done in anticipation of surgery and is not appropriate for emergency situations. It takes many days to process the donated blood. RISKS AND COMPLICATIONS Although transfusion therapy is very safe and saves many lives, the main dangers of transfusion include:   Getting an infectious disease.  Developing a transfusion reaction. This is an allergic reaction to something in the blood you were given. Every precaution is taken to prevent this. The decision to have a blood transfusion has been considered carefully by your caregiver before blood is given. Blood is not given unless the benefits outweigh the risks. AFTER THE TRANSFUSION  Right after receiving a blood transfusion, you will usually feel much better and more energetic. This is especially true if your red blood cells have gotten low (anemic). The transfusion raises the level of the red blood cells which carry oxygen, and this usually causes an energy increase.  The nurse administering the transfusion will  monitor you carefully for complications. HOME CARE INSTRUCTIONS  No special instructions are needed after a transfusion. You may find your energy is better. Speak with your caregiver about any limitations on activity for underlying diseases you may have. SEEK MEDICAL CARE IF:   Your condition is not improving after your transfusion.  You develop redness or irritation at the intravenous (IV) site. SEEK IMMEDIATE MEDICAL CARE IF:  Any of the following  symptoms occur over the next 12 hours:  Shaking chills.  You have a temperature by mouth above 102 F (38.9 C), not controlled by medicine.  Chest, back, or muscle pain.  People around you feel you are not acting correctly or are confused.  Shortness of breath or difficulty breathing.  Dizziness and fainting.  You get a rash or develop hives.  You have a decrease in urine output.  Your urine turns a dark color or changes to pink, red, or brown. Any of the following symptoms occur over the next 10 days:  You have a temperature by mouth above 102 F (38.9 C), not controlled by medicine.  Shortness of breath.  Weakness after normal activity.  The white part of the eye turns yellow (jaundice).  You have a decrease in the amount of urine or are urinating less often.  Your urine turns a dark color or changes to pink, red, or brown. Document Released: 01/31/2000 Document Revised: 04/27/2011 Document Reviewed: 09/19/2007 [facility] Patient Information 2014 [place], [place].  _______________________________________________________________________

## 2017-07-27 ENCOUNTER — Other Ambulatory Visit: Payer: Self-pay

## 2017-07-27 ENCOUNTER — Encounter (HOSPITAL_COMMUNITY)
Admission: RE | Admit: 2017-07-27 | Discharge: 2017-07-27 | Disposition: A | Discharge status: Home / Self Care | Payer: PRIVATE HEALTH INSURANCE | Source: Ambulatory Visit | Attending: Urology | Admitting: Urology

## 2017-07-27 ENCOUNTER — Encounter (HOSPITAL_COMMUNITY): Payer: Self-pay

## 2017-07-27 DIAGNOSIS — Z01810 Encounter for preprocedural cardiovascular examination: Secondary | ICD-10-CM | POA: Insufficient documentation

## 2017-07-27 DIAGNOSIS — Z01812 Encounter for preprocedural laboratory examination: Secondary | ICD-10-CM | POA: Diagnosis present

## 2017-07-27 DIAGNOSIS — C61 Malignant neoplasm of prostate: Secondary | ICD-10-CM | POA: Insufficient documentation

## 2017-07-27 HISTORY — DX: Essential (primary) hypertension: I10

## 2017-07-27 LAB — BASIC METABOLIC PANEL
ANION GAP: 5 (ref 5–15)
BUN: 12 mg/dL (ref 6–20)
CO2: 30 mmol/L (ref 22–32)
Calcium: 9.1 mg/dL (ref 8.9–10.3)
Chloride: 105 mmol/L (ref 101–111)
Creatinine, Ser: 0.71 mg/dL (ref 0.61–1.24)
GFR calc Af Amer: >60 mL/min (ref >60)
GLUCOSE: 103 mg/dL — AB (ref 65–99)
POTASSIUM: 4.5 mmol/L (ref 3.5–5.1)
Sodium: 140 mmol/L (ref 135–145)

## 2017-07-27 LAB — CBC
HEMATOCRIT: 41.9 % (ref 39.0–52.0)
HEMOGLOBIN: 13.6 g/dL (ref 13.0–17.0)
MCH: 30.4 pg (ref 26.0–34.0)
MCHC: 32.5 g/dL (ref 30.0–36.0)
MCV: 93.5 fL (ref 78.0–100.0)
Platelets: 255 10*3/uL (ref 150–400)
RBC: 4.48 MIL/uL (ref 4.22–5.81)
RDW: 13.3 % (ref 11.5–15.5)
WBC: 5 10*3/uL (ref 4.0–10.5)

## 2017-07-27 LAB — ABO/RH: ABO/RH(D): O POS

## 2017-07-30 NOTE — H&P (Signed)
    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 He underwent a TRUS biopsy of the prostate on 05/25/17 that confirmed Gleason 3+4=7 adenocarcinoma of the prostate with 3 out of 12 biopsy cores positive for malignancy.   Family history: None.   Imaging studies: None.   PMH: He has a history of hyperlipidemia and hypertension.  PSH: No abdominal surgeries.   TNM stage: cT1c Nx Mx  PSA: 4.61  Gleason score: 3+4=7  Biopsy (05/25/17): 3/12 cores positive  Left: L lateral base (30%, 3+3=6)  Right: R lateral mid (< 5%, 3+3=6), R lateral base (40%, 3+4=7)  Prostate volume: 25.7 cc   Nomogram  OC disease: 52%  EPE: 47%  SVI: 2%  LNI: 2%  PFS (5 year, 10 year): 89%, 81%   Urinary function: IPSS is 8.  Erectile function: SHIM score is 25.     ALLERGIES: No Allergies    MEDICATIONS: Aspirin 81 mg tablet,chewable  Simvastatin  Adderall  Amlodipine Besilate  Benazepril Hcl  Fluticasone Propionate  Multiple Vitamin     GU PSH: Prostate Needle Biopsy - 05/25/2017    NON-GU PSH: Surgical Pathology, Gross And Microscopic Examination For Prostate Needle - 05/25/2017    GU PMH: No GU PMH    NON-GU PMH: Hypercholesterolemia Hypertension    FAMILY HISTORY: 3 daughters - Runs in Family Lung Cancer - Father   SOCIAL HISTORY: Marital Status: Married Preferred Language: English; Ethnicity: Not Hispanic Or Latino; Race: White Current Smoking Status: Patient has never smoked.   Tobacco Use Assessment Completed: Used Tobacco in last 30 days? Does drink.  Drinks 1 caffeinated drink per day.    REVIEW OF SYSTEMS:    GU Review Male:   Patient denies frequent urination, hard to postpone urination, burning/ pain with urination, get up at night to urinate, leakage of urine, stream starts and stops, trouble starting your streams, and have to strain to urinate .  Gastrointestinal (Lower):   Patient denies diarrhea and  constipation.  Gastrointestinal (Upper):   Patient denies nausea and vomiting.  Constitutional:   Patient denies fever, night sweats, weight loss, and fatigue.  Skin:   Patient denies skin rash/ lesion and itching.  Eyes:   Patient denies blurred vision and double vision.  Ears/ Nose/ Throat:   Patient denies sore throat and sinus problems.  Hematologic/Lymphatic:   Patient denies swollen glands and easy bruising.  Cardiovascular:   Patient denies leg swelling and chest pains.  Respiratory:   Patient denies cough and shortness of breath.  Endocrine:   Patient denies excessive thirst.  Musculoskeletal:   Patient denies back pain and joint pain.  Neurological:   Patient denies headaches and dizziness.  Psychologic:   Patient denies depression and anxiety.     MULTI-SYSTEM PHYSICAL EXAMINATION:    Constitutional: Well-nourished. No physical deformities. Normally developed. Good grooming.  Respiratory: No labored breathing, no use of accessory muscles. Normal breath sounds.  Cardiovascular: Normal temperature, normal extremity pulses, no swelling, no varicosities.        ASSESSMENT:      ICD-10 Details  1 GU:   Prostate Cancer - C61    PLAN:      1. Prostate cancer:  He will be scheduled for a bilateral nerve-sparing robot assisted laparoscopic radical prostatectomy and bilateral pelvic lymphadenectomy.

## 2017-08-01 NOTE — Anesthesia Preprocedure Evaluation (Signed)
Anesthesia Evaluation  Patient identified by MRN, date of birth, ID band Patient awake    Reviewed: Allergy & Precautions, H&P , NPO status , Patient's Chart, lab work & pertinent test results, reviewed documented beta blocker date and time   Airway Mallampati: II  TM Distance: >3 FB Neck ROM: full    Dental no notable dental hx.    Pulmonary    Pulmonary exam normal breath sounds clear to auscultation       Cardiovascular Exercise Tolerance: Good hypertension, Pt. on medications  Rhythm:regular Rate:Normal     Neuro/Psych    GI/Hepatic   Endo/Other    Renal/GU      Musculoskeletal   Abdominal   Peds  Hematology   Anesthesia Other Findings   Reproductive/Obstetrics                             Anesthesia Physical Anesthesia Plan  ASA: II  Anesthesia Plan: General   Post-op Pain Management:    Induction: Intravenous  PONV Risk Score and Plan: 2 and Ondansetron, Treatment may vary due to age or medical condition and Dexamethasone  Airway Management Planned: Oral ETT  Additional Equipment:   Intra-op Plan:   Post-operative Plan: Extubation in OR  Informed Consent: I have reviewed the patients History and Physical, chart, labs and discussed the procedure including the risks, benefits and alternatives for the proposed anesthesia with the patient or authorized representative who has indicated his/her understanding and acceptance.   Dental Advisory Given  Plan Discussed with: CRNA, Anesthesiologist and Surgeon  Anesthesia Plan Comments: (  )        Anesthesia Quick Evaluation

## 2017-08-02 ENCOUNTER — Ambulatory Visit (HOSPITAL_COMMUNITY): Payer: PRIVATE HEALTH INSURANCE | Admitting: Anesthesiology

## 2017-08-02 ENCOUNTER — Encounter (HOSPITAL_COMMUNITY)
Admission: RE | Disposition: A | Discharge status: Home / Self Care | Payer: Self-pay | Source: Ambulatory Visit | Attending: Urology

## 2017-08-02 ENCOUNTER — Observation Stay (HOSPITAL_COMMUNITY)
Admission: RE | Admit: 2017-08-02 | Discharge: 2017-08-03 | Disposition: A | Discharge status: Home / Self Care | Payer: PRIVATE HEALTH INSURANCE | Source: Ambulatory Visit | Attending: Urology | Admitting: Urology

## 2017-08-02 ENCOUNTER — Other Ambulatory Visit: Payer: Self-pay

## 2017-08-02 ENCOUNTER — Encounter (HOSPITAL_COMMUNITY): Payer: Self-pay | Admitting: *Deleted

## 2017-08-02 DIAGNOSIS — C61 Malignant neoplasm of prostate: Secondary | ICD-10-CM | POA: Diagnosis present

## 2017-08-02 DIAGNOSIS — E7800 Pure hypercholesterolemia, unspecified: Secondary | ICD-10-CM | POA: Diagnosis not present

## 2017-08-02 DIAGNOSIS — Z7982 Long term (current) use of aspirin: Secondary | ICD-10-CM | POA: Diagnosis not present

## 2017-08-02 DIAGNOSIS — Z801 Family history of malignant neoplasm of trachea, bronchus and lung: Secondary | ICD-10-CM | POA: Diagnosis not present

## 2017-08-02 DIAGNOSIS — E785 Hyperlipidemia, unspecified: Secondary | ICD-10-CM | POA: Diagnosis not present

## 2017-08-02 DIAGNOSIS — I10 Essential (primary) hypertension: Secondary | ICD-10-CM | POA: Diagnosis not present

## 2017-08-02 DIAGNOSIS — Z79899 Other long term (current) drug therapy: Secondary | ICD-10-CM | POA: Diagnosis not present

## 2017-08-02 HISTORY — PX: LYMPHADENECTOMY: SHX5960

## 2017-08-02 HISTORY — PX: ROBOT ASSISTED LAPAROSCOPIC RADICAL PROSTATECTOMY: SHX5141

## 2017-08-02 LAB — HEMOGLOBIN AND HEMATOCRIT, BLOOD
HCT: 42.3 % (ref 39.0–52.0)
HEMOGLOBIN: 13.8 g/dL (ref 13.0–17.0)

## 2017-08-02 LAB — TYPE AND SCREEN
ABO/RH(D): O POS
Antibody Screen: NEGATIVE

## 2017-08-02 SURGERY — XI ROBOTIC ASSISTED LAPAROSCOPIC RADICAL PROSTATECTOMY LEVEL 2
Anesthesia: General

## 2017-08-02 MED ORDER — OXYCODONE HCL 5 MG PO TABS: 5.0000 mg | ORAL_TABLET | Freq: Once | ORAL | Status: DC | PRN

## 2017-08-02 MED ORDER — BUPIVACAINE-EPINEPHRINE 0.25% -1:200000 IJ SOLN
INTRAMUSCULAR | Status: DC | PRN
  Administered 2017-08-02: 30 mL

## 2017-08-02 MED ORDER — HEPARIN SODIUM (PORCINE) 1000 UNIT/ML IJ SOLN
INTRAMUSCULAR | Status: AC
  Filled 2017-08-02: qty 1

## 2017-08-02 MED ORDER — ONDANSETRON HCL 4 MG/2ML IJ SOLN
INTRAMUSCULAR | Status: DC | PRN
  Administered 2017-08-02: 4 mg via INTRAVENOUS

## 2017-08-02 MED ORDER — LACTATED RINGERS IV SOLN
INTRAVENOUS | Status: DC
  Administered 2017-08-02 (×2): via INTRAVENOUS

## 2017-08-02 MED ORDER — ACETAMINOPHEN 160 MG/5ML PO SOLN: 325.0000 mg | ORAL | Status: DC | PRN

## 2017-08-02 MED ORDER — POLYVINYL ALCOHOL 1.4 % OP SOLN
1.0000 [drp] | Freq: Every day | OPHTHALMIC | Status: DC | PRN
  Filled 2017-08-02: qty 15

## 2017-08-02 MED ORDER — ACETAMINOPHEN 325 MG PO TABS
650.0000 mg | ORAL_TABLET | ORAL | Status: DC | PRN
  Administered 2017-08-02: 650 mg via ORAL
  Filled 2017-08-02: qty 2

## 2017-08-02 MED ORDER — LIDOCAINE HCL (CARDIAC) PF 100 MG/5ML IV SOSY
PREFILLED_SYRINGE | INTRAVENOUS | Status: DC | PRN
  Administered 2017-08-02: 100 mg via INTRAVENOUS

## 2017-08-02 MED ORDER — ONDANSETRON HCL 4 MG/2ML IJ SOLN
INTRAMUSCULAR | Status: AC
  Filled 2017-08-02: qty 2

## 2017-08-02 MED ORDER — CEFAZOLIN SODIUM-DEXTROSE 1-4 GM/50ML-% IV SOLN
1.0000 g | Freq: Three times a day (TID) | INTRAVENOUS | Status: AC
  Administered 2017-08-02 – 2017-08-03 (×2): 1 g via INTRAVENOUS
  Filled 2017-08-02 (×2): qty 50

## 2017-08-02 MED ORDER — ONDANSETRON HCL 4 MG/2ML IJ SOLN: 4.0000 mg | Freq: Once | INTRAMUSCULAR | Status: DC | PRN

## 2017-08-02 MED ORDER — MORPHINE SULFATE (PF) 2 MG/ML IV SOLN
2.0000 mg | INTRAVENOUS | Status: DC | PRN
  Filled 2017-08-02: qty 1

## 2017-08-02 MED ORDER — PROPOFOL 10 MG/ML IV BOLUS
INTRAVENOUS | Status: DC | PRN
  Administered 2017-08-02: 200 mg via INTRAVENOUS

## 2017-08-02 MED ORDER — MEPERIDINE HCL 50 MG/ML IJ SOLN: 6.2500 mg | INTRAMUSCULAR | Status: DC | PRN

## 2017-08-02 MED ORDER — FLUTICASONE PROPIONATE 50 MCG/ACT NA SUSP
1.0000 | Freq: Every day | NASAL | Status: DC | PRN
  Filled 2017-08-02: qty 16

## 2017-08-02 MED ORDER — BUPIVACAINE-EPINEPHRINE (PF) 0.25% -1:200000 IJ SOLN
INTRAMUSCULAR | Status: AC
  Filled 2017-08-02: qty 30

## 2017-08-02 MED ORDER — FENTANYL CITRATE (PF) 100 MCG/2ML IJ SOLN
25.0000 ug | INTRAMUSCULAR | Status: DC | PRN
  Administered 2017-08-02: 50 ug via INTRAVENOUS

## 2017-08-02 MED ORDER — ROCURONIUM BROMIDE 100 MG/10ML IV SOLN
INTRAVENOUS | Status: DC | PRN
  Administered 2017-08-02: 50 mg via INTRAVENOUS
  Administered 2017-08-02: 10 mg via INTRAVENOUS
  Administered 2017-08-02: 20 mg via INTRAVENOUS

## 2017-08-02 MED ORDER — CEFAZOLIN SODIUM-DEXTROSE 2-4 GM/100ML-% IV SOLN
2.0000 g | Freq: Once | INTRAVENOUS | Status: DC
  Filled 2017-08-02: qty 100

## 2017-08-02 MED ORDER — SUGAMMADEX SODIUM 200 MG/2ML IV SOLN
INTRAVENOUS | Status: DC | PRN
  Administered 2017-08-02: 200 mg via INTRAVENOUS

## 2017-08-02 MED ORDER — TRAMADOL HCL 50 MG PO TABS: 50.0000 mg | ORAL_TABLET | Freq: Four times a day (QID) | ORAL | 0 refills | Status: AC | PRN

## 2017-08-02 MED ORDER — LACTATED RINGERS IV SOLN
INTRAVENOUS | Status: DC | PRN
  Administered 2017-08-02: 1000 mL

## 2017-08-02 MED ORDER — MIDAZOLAM HCL 5 MG/5ML IJ SOLN
INTRAMUSCULAR | Status: DC | PRN
  Administered 2017-08-02: 2 mg via INTRAVENOUS

## 2017-08-02 MED ORDER — STERILE WATER FOR IRRIGATION IR SOLN
Status: DC | PRN
  Administered 2017-08-02: 1000 mL

## 2017-08-02 MED ORDER — AMLODIPINE BESYLATE 5 MG PO TABS
2.5000 mg | ORAL_TABLET | Freq: Every day | ORAL | Status: DC
  Administered 2017-08-03: 2.5 mg via ORAL
  Filled 2017-08-02: qty 1

## 2017-08-02 MED ORDER — FENTANYL CITRATE (PF) 100 MCG/2ML IJ SOLN
INTRAMUSCULAR | Status: DC | PRN
  Administered 2017-08-02: 50 ug via INTRAVENOUS
  Administered 2017-08-02: 100 ug via INTRAVENOUS
  Administered 2017-08-02: 25 ug via INTRAVENOUS
  Administered 2017-08-02: 50 ug via INTRAVENOUS
  Administered 2017-08-02: 25 ug via INTRAVENOUS

## 2017-08-02 MED ORDER — SULFAMETHOXAZOLE-TRIMETHOPRIM 800-160 MG PO TABS: 1.0000 | ORAL_TABLET | Freq: Two times a day (BID) | ORAL | 0 refills | Status: AC

## 2017-08-02 MED ORDER — FENTANYL CITRATE (PF) 100 MCG/2ML IJ SOLN
INTRAMUSCULAR | Status: AC
  Filled 2017-08-02: qty 2

## 2017-08-02 MED ORDER — HYDROMORPHONE HCL 1 MG/ML IJ SOLN
INTRAMUSCULAR | Status: AC
  Filled 2017-08-02: qty 2

## 2017-08-02 MED ORDER — AMPHETAMINE-DEXTROAMPHETAMINE 10 MG PO TABS: 10.0000 mg | ORAL_TABLET | Freq: Two times a day (BID) | ORAL | Status: DC

## 2017-08-02 MED ORDER — FENTANYL CITRATE (PF) 250 MCG/5ML IJ SOLN
INTRAMUSCULAR | Status: AC
  Filled 2017-08-02: qty 5

## 2017-08-02 MED ORDER — ROCURONIUM BROMIDE 10 MG/ML (PF) SYRINGE
PREFILLED_SYRINGE | INTRAVENOUS | Status: AC
  Filled 2017-08-02: qty 5

## 2017-08-02 MED ORDER — MIDAZOLAM HCL 2 MG/2ML IJ SOLN
INTRAMUSCULAR | Status: AC
  Filled 2017-08-02: qty 2

## 2017-08-02 MED ORDER — CEFAZOLIN SODIUM-DEXTROSE 2-3 GM-%(50ML) IV SOLR
INTRAVENOUS | Status: DC | PRN
Start: 2017-08-02 — End: 2017-08-02
  Administered 2017-08-02: 2 g via INTRAVENOUS

## 2017-08-02 MED ORDER — LIDOCAINE 2% (20 MG/ML) 5 ML SYRINGE
INTRAMUSCULAR | Status: AC
  Filled 2017-08-02: qty 5

## 2017-08-02 MED ORDER — DEXAMETHASONE SODIUM PHOSPHATE 10 MG/ML IJ SOLN
INTRAMUSCULAR | Status: AC
  Filled 2017-08-02: qty 1

## 2017-08-02 MED ORDER — HYDROMORPHONE HCL 1 MG/ML IJ SOLN
0.2500 mg | INTRAMUSCULAR | Status: DC | PRN
  Administered 2017-08-02 (×4): 0.5 mg via INTRAVENOUS

## 2017-08-02 MED ORDER — PROPOFOL 10 MG/ML IV BOLUS
INTRAVENOUS | Status: AC
  Filled 2017-08-02: qty 20

## 2017-08-02 MED ORDER — SODIUM CHLORIDE 0.9 % IV BOLUS
1000.0000 mL | Freq: Once | INTRAVENOUS | Status: AC
  Administered 2017-08-02: 1000 mL via INTRAVENOUS

## 2017-08-02 MED ORDER — ACETAMINOPHEN 325 MG PO TABS: 325.0000 mg | ORAL_TABLET | ORAL | Status: DC | PRN

## 2017-08-02 MED ORDER — SIMVASTATIN 10 MG PO TABS
10.0000 mg | ORAL_TABLET | Freq: Every evening | ORAL | Status: DC
  Administered 2017-08-02: 10 mg via ORAL
  Filled 2017-08-02: qty 1

## 2017-08-02 MED ORDER — DIPHENHYDRAMINE HCL 50 MG/ML IJ SOLN: 12.5000 mg | Freq: Four times a day (QID) | INTRAMUSCULAR | Status: DC | PRN

## 2017-08-02 MED ORDER — SODIUM CHLORIDE 0.9 % IR SOLN
Status: DC | PRN
  Administered 2017-08-02: 1000 mL

## 2017-08-02 MED ORDER — DOCUSATE SODIUM 100 MG PO CAPS
100.0000 mg | ORAL_CAPSULE | Freq: Two times a day (BID) | ORAL | Status: DC
  Administered 2017-08-02 – 2017-08-03 (×2): 100 mg via ORAL
  Filled 2017-08-02 (×2): qty 1

## 2017-08-02 MED ORDER — OXYCODONE HCL 5 MG/5ML PO SOLN: 5.0000 mg | Freq: Once | ORAL | Status: DC | PRN

## 2017-08-02 MED ORDER — DEXAMETHASONE SODIUM PHOSPHATE 10 MG/ML IJ SOLN
INTRAMUSCULAR | Status: DC | PRN
  Administered 2017-08-02: 10 mg via INTRAVENOUS

## 2017-08-02 MED ORDER — KETOROLAC TROMETHAMINE 15 MG/ML IJ SOLN
15.0000 mg | Freq: Four times a day (QID) | INTRAMUSCULAR | Status: DC
  Administered 2017-08-02 – 2017-08-03 (×3): 15 mg via INTRAVENOUS
  Filled 2017-08-02 (×3): qty 1

## 2017-08-02 MED ORDER — DIPHENHYDRAMINE HCL 12.5 MG/5ML PO ELIX: 12.5000 mg | ORAL_SOLUTION | Freq: Four times a day (QID) | ORAL | Status: DC | PRN

## 2017-08-02 MED ORDER — KCL IN DEXTROSE-NACL 20-5-0.45 MEQ/L-%-% IV SOLN
INTRAVENOUS | Status: DC
  Administered 2017-08-02 – 2017-08-03 (×3): via INTRAVENOUS
  Filled 2017-08-02 (×3): qty 1000

## 2017-08-02 SURGICAL SUPPLY — 55 items
ADH SKN CLS APL DERMABOND .7 (GAUZE/BANDAGES/DRESSINGS)
APPLICATOR COTTON TIP 6IN STRL (MISCELLANEOUS) ×4 IMPLANT
CATH FOLEY 2WAY SLVR 18FR 30CC (CATHETERS) ×4 IMPLANT
CATH ROBINSON RED A/P 16FR (CATHETERS) ×4 IMPLANT
CATH ROBINSON RED A/P 8FR (CATHETERS) ×4 IMPLANT
CATH TIEMANN FOLEY 18FR 5CC (CATHETERS) ×4 IMPLANT
CHLORAPREP W/TINT 26ML (MISCELLANEOUS) ×4 IMPLANT
CLIP VESOLOCK LG 6/CT PURPLE (CLIP) ×10 IMPLANT
COVER SURGICAL LIGHT HANDLE (MISCELLANEOUS) ×4 IMPLANT
COVER TIP SHEARS 8 DVNC (MISCELLANEOUS) ×2 IMPLANT
COVER TIP SHEARS 8MM DA VINCI (MISCELLANEOUS) ×2
CUTTER ECHEON FLEX ENDO 45 340 (ENDOMECHANICALS) ×4 IMPLANT
DECANTER SPIKE VIAL GLASS SM (MISCELLANEOUS) ×4 IMPLANT
DERMABOND ADVANCED (GAUZE/BANDAGES/DRESSINGS)
DERMABOND ADVANCED .7 DNX12 (GAUZE/BANDAGES/DRESSINGS) IMPLANT
DRAPE ARM DVNC X/XI (DISPOSABLE) ×8 IMPLANT
DRAPE COLUMN DVNC XI (DISPOSABLE) ×2 IMPLANT
DRAPE DA VINCI XI ARM (DISPOSABLE) ×8
DRAPE DA VINCI XI COLUMN (DISPOSABLE) ×2
DRAPE SURG IRRIG POUCH 19X23 (DRAPES) ×4 IMPLANT
DRSG TEGADERM 4X4.75 (GAUZE/BANDAGES/DRESSINGS) ×4 IMPLANT
ELECT REM PT RETURN 15FT ADLT (MISCELLANEOUS) ×4 IMPLANT
GLOVE BIO SURGEON STRL SZ 6.5 (GLOVE) ×3 IMPLANT
GLOVE BIO SURGEONS STRL SZ 6.5 (GLOVE) ×1
GLOVE BIOGEL M STRL SZ7.5 (GLOVE) ×8 IMPLANT
GOWN STRL REUS W/TWL LRG LVL3 (GOWN DISPOSABLE) ×12 IMPLANT
HOLDER FOLEY CATH W/STRAP (MISCELLANEOUS) ×4 IMPLANT
IRRIG SUCT STRYKERFLOW 2 WTIP (MISCELLANEOUS) ×4
IRRIGATION SUCT STRKRFLW 2 WTP (MISCELLANEOUS) ×2 IMPLANT
IV LACTATED RINGERS 1000ML (IV SOLUTION) ×4 IMPLANT
NDL SAFETY ECLIPSE 18X1.5 (NEEDLE) ×2 IMPLANT
NEEDLE HYPO 18GX1.5 SHARP (NEEDLE) ×4
PACK ROBOT UROLOGY CUSTOM (CUSTOM PROCEDURE TRAY) ×4 IMPLANT
RELOAD STAPLE 45 4.1 GRN THCK (STAPLE) ×2 IMPLANT
SEAL CANN UNIV 5-8 DVNC XI (MISCELLANEOUS) ×8 IMPLANT
SEAL XI 5MM-8MM UNIVERSAL (MISCELLANEOUS) ×8
SOLUTION ELECTROLUBE (MISCELLANEOUS) ×4 IMPLANT
STAPLE RELOAD 45 GRN (STAPLE) ×2 IMPLANT
STAPLE RELOAD 45MM GREEN (STAPLE) ×4
SUT ETHILON 3 0 PS 1 (SUTURE) ×4 IMPLANT
SUT MNCRL 3 0 RB1 (SUTURE) ×2 IMPLANT
SUT MNCRL 3 0 VIOLET RB1 (SUTURE) ×2 IMPLANT
SUT MNCRL AB 4-0 PS2 18 (SUTURE) ×8 IMPLANT
SUT MONOCRYL 3 0 RB1 (SUTURE) ×4
SUT VIC AB 0 CT1 27 (SUTURE) ×4
SUT VIC AB 0 CT1 27XBRD ANTBC (SUTURE) ×2 IMPLANT
SUT VIC AB 0 UR5 27 (SUTURE) ×4 IMPLANT
SUT VIC AB 2-0 SH 27 (SUTURE) ×4
SUT VIC AB 2-0 SH 27X BRD (SUTURE) ×2 IMPLANT
SUT VICRYL 0 UR6 27IN ABS (SUTURE) ×8 IMPLANT
SYR 27GX1/2 1ML LL SAFETY (SYRINGE) ×4 IMPLANT
TOWEL OR 17X26 10 PK STRL BLUE (TOWEL DISPOSABLE) ×2 IMPLANT
TOWEL OR NON WOVEN STRL DISP B (DISPOSABLE) ×4 IMPLANT
TUBING INSUFFLATION 10FT LAP (TUBING) IMPLANT
WATER STERILE IRR 1000ML POUR (IV SOLUTION) ×8 IMPLANT

## 2017-08-02 NOTE — Transfer of Care (Signed)
Immediate Anesthesia Transfer of Care Note  Patient: [name], MD  Procedure(s) Performed: XI ROBOTIC ASSISTED LAPAROSCOPIC RADICAL PROSTATECTOMY LEVEL 2 (N/A ) LYMPHADENECTOMY, PELVIC (Bilateral )  Patient Location: PACU  Anesthesia Type:General  Level of Consciousness: awake, alert  and oriented  Airway & Oxygen Therapy: Patient Spontanous Breathing and Patient connected to face mask oxygen  Post-op Assessment: Report given to RN and Post -op Vital signs reviewed and stable  Post vital signs: Reviewed and stable  Last Vitals:  Vitals Value Taken Time  BP    Temp    Pulse 90 08/02/2017  1:53 PM  Resp    SpO2 100 % 08/02/2017  1:53 PM  Vitals shown include unvalidated device data.  Last Pain:  Vitals:   08/02/17 0940  TempSrc: Oral      Patients Stated Pain Goal: 3 (58/30/94 0768)  Complications: No apparent anesthesia complications

## 2017-08-02 NOTE — Anesthesia Procedure Notes (Signed)
Procedure Name: Intubation Date/Time: 08/02/2017 11:12 AM Performed by: Glory Buff, CRNA Pre-anesthesia Checklist: Patient identified Patient Re-evaluated:Patient Re-evaluated prior to induction Oxygen Delivery Method: Circle system utilized Preoxygenation: Pre-oxygenation with 100% oxygen Induction Type: IV induction Ventilation: Mask ventilation without difficulty Laryngoscope Size: Miller and 2 Grade View: Grade I Tube type: Oral Tube size: 7.5 mm Number of attempts: 1 Airway Equipment and Method: Stylet Placement Confirmation: ETT inserted through vocal cords under direct vision,  positive ETCO2 and breath sounds checked- equal and bilateral Secured at: 23 cm Tube secured with: Tape Dental Injury: Teeth and Oropharynx as per pre-operative assessment

## 2017-08-02 NOTE — Op Note (Signed)

## 2017-08-02 NOTE — Progress Notes (Signed)
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   MRN: [id number]  Post-op note  Subjective: The patient is doing well.  No complaints.  Objective: Vital signs in last 24 hours: Temp:  [98.1 F (36.7 C)-98.5 F (36.9 C)] 98.1 F (36.7 C) (06/17 1452) Pulse Rate:  [78-92] 88 (06/17 1452) Resp:  [11-18] 15 (06/17 1430) BP: (121-154)/(78-108) 144/91 (06/17 1452) SpO2:  [98 %-100 %] 98 % (06/17 1452) Weight:  [75.3 kg (166 lb)] 75.3 kg (166 lb) (06/17 1517)  Intake/Output from previous day: No intake/output data recorded. Intake/Output this shift: Total I/O In: 2350 [I.V.:1300; IV Piggyback:1050] Out: 415 [Urine:325; Drains:40; Blood:50]  Physical Exam:  General: Alert and oriented. Abdomen: Soft, Nondistended. Incisions: Clean and dry.  Lab Results: Recent Labs    08/02/17 1404  HGB 13.8  HCT 42.3    Assessment/Plan: POD#0   1) Continue to monitor, ambulate, IS   Gordon Bailey. MD   LOS: 0 days   Gordon Bailey,LES 08/02/2017, 4:57 PM

## 2017-08-02 NOTE — Anesthesia Postprocedure Evaluation (Signed)
Anesthesia Post Note  Patient: Gordon Portela, MD  Procedure(s) Performed: XI ROBOTIC ASSISTED LAPAROSCOPIC RADICAL PROSTATECTOMY LEVEL 2 (N/A ) LYMPHADENECTOMY, PELVIC (Bilateral )     Patient location during evaluation: PACU Anesthesia Type: General Level of consciousness: awake and alert Pain management: pain level controlled Vital Signs Assessment: post-procedure vital signs reviewed and stable Respiratory status: spontaneous breathing, nonlabored ventilation, respiratory function stable and patient connected to nasal cannula oxygen Cardiovascular status: blood pressure returned to baseline and stable Postop Assessment: no apparent nausea or vomiting Anesthetic complications: no    Last Vitals:  Vitals:   08/02/17 0940  BP: 131/78  Pulse: 78  Resp: 18  Temp: 36.9 C  SpO2: 100%    Last Pain:  Vitals:   08/02/17 0940  TempSrc: Oral                 Harper Smoker

## 2017-08-02 NOTE — Discharge Instructions (Signed)

## 2017-08-03 ENCOUNTER — Encounter (HOSPITAL_COMMUNITY): Payer: Self-pay | Admitting: Urology

## 2017-08-03 DIAGNOSIS — C61 Malignant neoplasm of prostate: Secondary | ICD-10-CM | POA: Diagnosis not present

## 2017-08-03 LAB — HEMOGLOBIN AND HEMATOCRIT, BLOOD
HEMATOCRIT: 40.2 % (ref 39.0–52.0)
Hemoglobin: 13.2 g/dL (ref 13.0–17.0)

## 2017-08-03 MED ORDER — BISACODYL 10 MG RE SUPP
10.0000 mg | Freq: Once | RECTAL | Status: AC
  Administered 2017-08-03: 10 mg via RECTAL
  Filled 2017-08-03: qty 1

## 2017-08-03 MED ORDER — TRAMADOL HCL 50 MG PO TABS: 50.0000 mg | ORAL_TABLET | Freq: Four times a day (QID) | ORAL | Status: DC | PRN

## 2017-08-03 NOTE — Progress Notes (Signed)
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   MRN: [id number]  1 Day Post-Op Subjective: The patient is doing well.  No nausea or vomiting. Pain is adequately controlled.  Objective: Vital signs in last 24 hours: Temp:  [98.1 F (36.7 C)-99.3 F (37.4 C)] 98.9 F (37.2 C) (06/18 0502) Pulse Rate:  [63-92] 63 (06/18 0502) Resp:  [11-18] 16 (06/18 0502) BP: (121-154)/(75-108) 128/85 (06/18 0502) SpO2:  [98 %-100 %] 100 % (06/18 0502) Weight:  [75.3 kg (166 lb)] 75.3 kg (166 lb) (06/17 1517)  Intake/Output from previous day: 06/17 0701 - 06/18 0700 In: 3305 [P.O.:360; I.V.:1595; IV Piggyback:1050] Out: 5427 [Urine:3875; Drains:195; Blood:50] Intake/Output this shift: No intake/output data recorded.  Physical Exam:  General: Alert and oriented. CV: RRR Lungs: Clear bilaterally. GI: Soft, Nondistended. Incisions: Clean, dry, and intact Urine: Clear Extremities: Nontender, no erythema, no edema.  Lab Results: Recent Labs    08/02/17 1404 08/03/17 0423  HGB 13.8 13.2  HCT 42.3 40.2      Assessment/Plan: POD# 1 s/p robotic prostatectomy.  1) SL IVF 2) Ambulate, Incentive spirometry 3) Transition to oral pain medication 4) Dulcolax suppository 5) D/C pelvic drain 6) Plan for likely discharge later today   Pryor Curia. MD   LOS: 0 days   Krissa Utke,LES 08/03/2017, 8:22 AM

## 2017-08-03 NOTE — Discharge Summary (Signed)
  Date of admission: 08/02/2017  Date of discharge: 08/03/2017  Admission diagnosis: Prostate Cancer  Discharge diagnosis: Prostate Cancer  History and Physical: For full details, please see admission history and physical. 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  After discussing management/treatment options, he elected to proceed with surgical treatment.  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 He tolerated this procedure well and without complications. Postoperatively, he was able to be transferred to a regular hospital room following recovery from anesthesia.  He was able to begin ambulating the night of surgery. He remained hemodynamically stable overnight.  He had excellent urine output with appropriately minimal output from his pelvic drain and his pelvic drain was removed on POD #1.  He was transitioned to oral pain medication, tolerated a clear liquid diet, and had met all discharge criteria and was able to be discharged home later on POD#1.  Laboratory values:  Recent Labs    08/02/17 1404 08/03/17 0423  HGB 13.8 13.2  HCT 42.3 40.2    Disposition: Home  Discharge instruction: He was instructed to be ambulatory but to refrain from heavy lifting, strenuous activity, or driving. He was instructed on urethral catheter care.  Discharge medications:   Allergies as of 08/03/2017   No Known Allergies     Medication List    STOP taking these medications   aspirin EC 81 MG tablet   Fish Oil 1200 MG Caps   Vitamin D 2000 units tablet     TAKE these medications   amLODipine 2.5 MG tablet Commonly known as:  NORVASC Take 2.5 mg by mouth daily.   amphetamine-dextroamphetamine 10 MG tablet Commonly known as:  ADDERALL Take 10 mg by mouth 2 (two) times daily with a meal.   ARTIFICIAL TEARS 0.1-0.3 % Soln Generic drug:   Dextran 70-Hypromellose Place 1 drop into both eyes daily as needed (dry eyes).   benazepril 20 MG tablet Commonly known as:  LOTENSIN Take 20 mg by mouth daily.   fluticasone 50 MCG/ACT nasal spray Commonly known as:  FLONASE Place 1 spray into both nostrils daily as needed for allergies or rhinitis.   simvastatin 10 MG tablet Commonly known as:  ZOCOR Take 10 mg by mouth every evening.   sulfamethoxazole-trimethoprim 800-160 MG tablet Commonly known as:  BACTRIM DS,SEPTRA DS Take 1 tablet by mouth 2 (two) times daily. Start the day prior to foley removal appointment   traMADol 50 MG tablet Commonly known as:  ULTRAM Take 1-2 tablets (50-100 mg total) by mouth every 6 (six) hours as needed for moderate pain or severe pain.       Followup: He will followup in 1 week for catheter removal and to discuss his surgical pathology results.

## 2018-03-21 DIAGNOSIS — L570 Actinic keratosis: Secondary | ICD-10-CM | POA: Diagnosis not present

## 2018-03-21 DIAGNOSIS — C4441 Basal cell carcinoma of skin of scalp and neck: Secondary | ICD-10-CM | POA: Diagnosis not present

## 2018-05-04 DIAGNOSIS — C61 Malignant neoplasm of prostate: Secondary | ICD-10-CM | POA: Diagnosis not present

## 2018-10-25 DIAGNOSIS — L814 Other melanin hyperpigmentation: Secondary | ICD-10-CM | POA: Diagnosis not present

## 2018-10-25 DIAGNOSIS — Z85828 Personal history of other malignant neoplasm of skin: Secondary | ICD-10-CM | POA: Diagnosis not present

## 2018-10-25 DIAGNOSIS — L570 Actinic keratosis: Secondary | ICD-10-CM | POA: Diagnosis not present

## 2018-10-25 DIAGNOSIS — L565 Disseminated superficial actinic porokeratosis (DSAP): Secondary | ICD-10-CM | POA: Diagnosis not present

## 2018-11-15 DIAGNOSIS — C61 Malignant neoplasm of prostate: Secondary | ICD-10-CM | POA: Diagnosis not present

## 2018-11-22 DIAGNOSIS — C61 Malignant neoplasm of prostate: Secondary | ICD-10-CM | POA: Diagnosis not present

## 2018-12-22 DIAGNOSIS — Z23 Encounter for immunization: Secondary | ICD-10-CM | POA: Diagnosis not present

## 2018-12-22 DIAGNOSIS — E7800 Pure hypercholesterolemia, unspecified: Secondary | ICD-10-CM | POA: Diagnosis not present

## 2018-12-22 DIAGNOSIS — I10 Essential (primary) hypertension: Secondary | ICD-10-CM | POA: Diagnosis not present

## 2018-12-22 DIAGNOSIS — Z79899 Other long term (current) drug therapy: Secondary | ICD-10-CM | POA: Diagnosis not present

## 2018-12-22 DIAGNOSIS — Z0000 Encounter for general adult medical examination without abnormal findings: Secondary | ICD-10-CM | POA: Diagnosis not present

## 2019-05-22 DIAGNOSIS — C61 Malignant neoplasm of prostate: Secondary | ICD-10-CM | POA: Diagnosis not present

## 2019-06-01 DIAGNOSIS — L820 Inflamed seborrheic keratosis: Secondary | ICD-10-CM | POA: Diagnosis not present

## 2019-06-01 DIAGNOSIS — L570 Actinic keratosis: Secondary | ICD-10-CM | POA: Diagnosis not present

## 2019-06-01 DIAGNOSIS — Z85828 Personal history of other malignant neoplasm of skin: Secondary | ICD-10-CM | POA: Diagnosis not present

## 2019-06-01 DIAGNOSIS — L821 Other seborrheic keratosis: Secondary | ICD-10-CM | POA: Diagnosis not present

## 2019-06-01 DIAGNOSIS — L438 Other lichen planus: Secondary | ICD-10-CM | POA: Diagnosis not present

## 2019-06-01 DIAGNOSIS — D485 Neoplasm of uncertain behavior of skin: Secondary | ICD-10-CM | POA: Diagnosis not present

## 2019-06-22 DIAGNOSIS — I10 Essential (primary) hypertension: Secondary | ICD-10-CM | POA: Diagnosis not present

## 2019-06-22 DIAGNOSIS — E7800 Pure hypercholesterolemia, unspecified: Secondary | ICD-10-CM | POA: Diagnosis not present

## 2019-08-14 DIAGNOSIS — B308 Other viral conjunctivitis: Secondary | ICD-10-CM | POA: Diagnosis not present

## 2019-12-01 DIAGNOSIS — C61 Malignant neoplasm of prostate: Secondary | ICD-10-CM | POA: Diagnosis not present

## 2019-12-05 DIAGNOSIS — N5231 Erectile dysfunction following radical prostatectomy: Secondary | ICD-10-CM | POA: Diagnosis not present

## 2019-12-05 DIAGNOSIS — C61 Malignant neoplasm of prostate: Secondary | ICD-10-CM | POA: Diagnosis not present

## 2019-12-06 DIAGNOSIS — L718 Other rosacea: Secondary | ICD-10-CM | POA: Diagnosis not present

## 2019-12-06 DIAGNOSIS — D225 Melanocytic nevi of trunk: Secondary | ICD-10-CM | POA: Diagnosis not present

## 2019-12-06 DIAGNOSIS — D485 Neoplasm of uncertain behavior of skin: Secondary | ICD-10-CM | POA: Diagnosis not present

## 2019-12-06 DIAGNOSIS — Z85828 Personal history of other malignant neoplasm of skin: Secondary | ICD-10-CM | POA: Diagnosis not present

## 2019-12-06 DIAGNOSIS — L820 Inflamed seborrheic keratosis: Secondary | ICD-10-CM | POA: Diagnosis not present

## 2019-12-06 DIAGNOSIS — L821 Other seborrheic keratosis: Secondary | ICD-10-CM | POA: Diagnosis not present

## 2019-12-06 DIAGNOSIS — L570 Actinic keratosis: Secondary | ICD-10-CM | POA: Diagnosis not present

## 2019-12-28 DIAGNOSIS — E7800 Pure hypercholesterolemia, unspecified: Secondary | ICD-10-CM | POA: Diagnosis not present

## 2019-12-28 DIAGNOSIS — Z0000 Encounter for general adult medical examination without abnormal findings: Secondary | ICD-10-CM | POA: Diagnosis not present

## 2019-12-28 DIAGNOSIS — Z79899 Other long term (current) drug therapy: Secondary | ICD-10-CM | POA: Diagnosis not present

## 2019-12-28 DIAGNOSIS — I10 Essential (primary) hypertension: Secondary | ICD-10-CM | POA: Diagnosis not present

## 2020-05-20 ENCOUNTER — Other Ambulatory Visit (HOSPITAL_COMMUNITY): Payer: Self-pay | Admitting: *Deleted

## 2020-05-27 ENCOUNTER — Ambulatory Visit (HOSPITAL_BASED_OUTPATIENT_CLINIC_OR_DEPARTMENT_OTHER)
Admission: RE | Admit: 2020-05-27 | Discharge: 2020-05-27 | Disposition: A | Discharge status: Home / Self Care | Payer: PRIVATE HEALTH INSURANCE | Source: Ambulatory Visit | Attending: Cardiology | Admitting: Cardiology

## 2020-05-27 ENCOUNTER — Other Ambulatory Visit: Payer: Self-pay

## 2020-06-25 DIAGNOSIS — C61 Malignant neoplasm of prostate: Secondary | ICD-10-CM | POA: Diagnosis not present

## 2020-08-12 DIAGNOSIS — K219 Gastro-esophageal reflux disease without esophagitis: Secondary | ICD-10-CM | POA: Diagnosis not present

## 2020-08-12 DIAGNOSIS — L570 Actinic keratosis: Secondary | ICD-10-CM | POA: Diagnosis not present

## 2020-08-12 DIAGNOSIS — D0462 Carcinoma in situ of skin of left upper limb, including shoulder: Secondary | ICD-10-CM | POA: Diagnosis not present

## 2020-08-12 DIAGNOSIS — Z85828 Personal history of other malignant neoplasm of skin: Secondary | ICD-10-CM | POA: Diagnosis not present

## 2020-08-12 DIAGNOSIS — I10 Essential (primary) hypertension: Secondary | ICD-10-CM | POA: Diagnosis not present

## 2020-08-12 DIAGNOSIS — L821 Other seborrheic keratosis: Secondary | ICD-10-CM | POA: Diagnosis not present

## 2020-08-12 DIAGNOSIS — D1801 Hemangioma of skin and subcutaneous tissue: Secondary | ICD-10-CM | POA: Diagnosis not present

## 2020-08-12 DIAGNOSIS — D225 Melanocytic nevi of trunk: Secondary | ICD-10-CM | POA: Diagnosis not present

## 2021-03-04 DIAGNOSIS — L821 Other seborrheic keratosis: Secondary | ICD-10-CM | POA: Diagnosis not present

## 2021-03-04 DIAGNOSIS — L820 Inflamed seborrheic keratosis: Secondary | ICD-10-CM | POA: Diagnosis not present

## 2021-03-04 DIAGNOSIS — D1801 Hemangioma of skin and subcutaneous tissue: Secondary | ICD-10-CM | POA: Diagnosis not present

## 2021-03-04 DIAGNOSIS — Z85828 Personal history of other malignant neoplasm of skin: Secondary | ICD-10-CM | POA: Diagnosis not present

## 2021-03-04 DIAGNOSIS — D485 Neoplasm of uncertain behavior of skin: Secondary | ICD-10-CM | POA: Diagnosis not present

## 2021-03-04 DIAGNOSIS — L430 Hypertrophic lichen planus: Secondary | ICD-10-CM | POA: Diagnosis not present

## 2021-03-04 DIAGNOSIS — L570 Actinic keratosis: Secondary | ICD-10-CM | POA: Diagnosis not present

## 2021-03-11 DIAGNOSIS — D123 Benign neoplasm of transverse colon: Secondary | ICD-10-CM | POA: Diagnosis not present

## 2021-03-11 DIAGNOSIS — Z86010 Personal history of colonic polyps: Secondary | ICD-10-CM | POA: Diagnosis not present

## 2021-03-18 DIAGNOSIS — Z23 Encounter for immunization: Secondary | ICD-10-CM | POA: Diagnosis not present

## 2021-03-18 DIAGNOSIS — I10 Essential (primary) hypertension: Secondary | ICD-10-CM | POA: Diagnosis not present

## 2021-03-18 DIAGNOSIS — Z79899 Other long term (current) drug therapy: Secondary | ICD-10-CM | POA: Diagnosis not present

## 2021-03-18 DIAGNOSIS — Z0000 Encounter for general adult medical examination without abnormal findings: Secondary | ICD-10-CM | POA: Diagnosis not present

## 2021-03-18 DIAGNOSIS — E7800 Pure hypercholesterolemia, unspecified: Secondary | ICD-10-CM | POA: Diagnosis not present

## 2021-04-01 DIAGNOSIS — F4322 Adjustment disorder with anxiety: Secondary | ICD-10-CM | POA: Diagnosis not present

## 2021-04-07 DIAGNOSIS — C61 Malignant neoplasm of prostate: Secondary | ICD-10-CM | POA: Diagnosis not present

## 2021-04-22 DIAGNOSIS — F4322 Adjustment disorder with anxiety: Secondary | ICD-10-CM | POA: Diagnosis not present

## 2021-04-22 DIAGNOSIS — N5231 Erectile dysfunction following radical prostatectomy: Secondary | ICD-10-CM | POA: Diagnosis not present

## 2021-04-22 DIAGNOSIS — C61 Malignant neoplasm of prostate: Secondary | ICD-10-CM | POA: Diagnosis not present

## 2021-04-29 DIAGNOSIS — F4322 Adjustment disorder with anxiety: Secondary | ICD-10-CM | POA: Diagnosis not present

## 2021-05-20 DIAGNOSIS — F4322 Adjustment disorder with anxiety: Secondary | ICD-10-CM | POA: Diagnosis not present

## 2021-07-15 DIAGNOSIS — F4322 Adjustment disorder with anxiety: Secondary | ICD-10-CM | POA: Diagnosis not present

## 2021-09-02 DIAGNOSIS — D2371 Other benign neoplasm of skin of right lower limb, including hip: Secondary | ICD-10-CM | POA: Diagnosis not present

## 2021-09-02 DIAGNOSIS — L820 Inflamed seborrheic keratosis: Secondary | ICD-10-CM | POA: Diagnosis not present

## 2021-09-02 DIAGNOSIS — L570 Actinic keratosis: Secondary | ICD-10-CM | POA: Diagnosis not present

## 2021-09-02 DIAGNOSIS — Z85828 Personal history of other malignant neoplasm of skin: Secondary | ICD-10-CM | POA: Diagnosis not present

## 2021-09-02 DIAGNOSIS — C44622 Squamous cell carcinoma of skin of right upper limb, including shoulder: Secondary | ICD-10-CM | POA: Diagnosis not present

## 2021-09-02 DIAGNOSIS — L821 Other seborrheic keratosis: Secondary | ICD-10-CM | POA: Diagnosis not present

## 2021-09-30 DIAGNOSIS — I10 Essential (primary) hypertension: Secondary | ICD-10-CM | POA: Diagnosis not present

## 2021-09-30 DIAGNOSIS — Z79899 Other long term (current) drug therapy: Secondary | ICD-10-CM | POA: Diagnosis not present

## 2021-12-01 DIAGNOSIS — M25561 Pain in right knee: Secondary | ICD-10-CM | POA: Diagnosis not present

## 2021-12-29 DIAGNOSIS — J322 Chronic ethmoidal sinusitis: Secondary | ICD-10-CM | POA: Diagnosis not present

## 2021-12-29 DIAGNOSIS — R0982 Postnasal drip: Secondary | ICD-10-CM | POA: Diagnosis not present

## 2021-12-29 DIAGNOSIS — J320 Chronic maxillary sinusitis: Secondary | ICD-10-CM | POA: Diagnosis not present

## 2021-12-29 DIAGNOSIS — J342 Deviated nasal septum: Secondary | ICD-10-CM | POA: Diagnosis not present

## 2021-12-29 DIAGNOSIS — J3489 Other specified disorders of nose and nasal sinuses: Secondary | ICD-10-CM | POA: Diagnosis not present

## 2021-12-29 DIAGNOSIS — J343 Hypertrophy of nasal turbinates: Secondary | ICD-10-CM | POA: Diagnosis not present

## 2022-03-09 DIAGNOSIS — J322 Chronic ethmoidal sinusitis: Secondary | ICD-10-CM | POA: Diagnosis not present

## 2022-03-09 DIAGNOSIS — J3489 Other specified disorders of nose and nasal sinuses: Secondary | ICD-10-CM | POA: Diagnosis not present

## 2022-03-09 DIAGNOSIS — J320 Chronic maxillary sinusitis: Secondary | ICD-10-CM | POA: Diagnosis not present

## 2022-03-09 DIAGNOSIS — J342 Deviated nasal septum: Secondary | ICD-10-CM | POA: Diagnosis not present

## 2022-03-09 DIAGNOSIS — J321 Chronic frontal sinusitis: Secondary | ICD-10-CM | POA: Diagnosis not present

## 2022-03-09 DIAGNOSIS — J343 Hypertrophy of nasal turbinates: Secondary | ICD-10-CM | POA: Diagnosis not present

## 2022-03-10 DIAGNOSIS — D2372 Other benign neoplasm of skin of left lower limb, including hip: Secondary | ICD-10-CM | POA: Diagnosis not present

## 2022-03-10 DIAGNOSIS — D485 Neoplasm of uncertain behavior of skin: Secondary | ICD-10-CM | POA: Diagnosis not present

## 2022-03-10 DIAGNOSIS — Z85828 Personal history of other malignant neoplasm of skin: Secondary | ICD-10-CM | POA: Diagnosis not present

## 2022-03-10 DIAGNOSIS — L821 Other seborrheic keratosis: Secondary | ICD-10-CM | POA: Diagnosis not present

## 2022-03-10 DIAGNOSIS — L570 Actinic keratosis: Secondary | ICD-10-CM | POA: Diagnosis not present

## 2022-03-10 DIAGNOSIS — D225 Melanocytic nevi of trunk: Secondary | ICD-10-CM | POA: Diagnosis not present

## 2022-04-21 DIAGNOSIS — C61 Malignant neoplasm of prostate: Secondary | ICD-10-CM | POA: Diagnosis not present

## 2022-05-05 DIAGNOSIS — C61 Malignant neoplasm of prostate: Secondary | ICD-10-CM | POA: Diagnosis not present

## 2022-06-02 DIAGNOSIS — Z0000 Encounter for general adult medical examination without abnormal findings: Secondary | ICD-10-CM | POA: Diagnosis not present

## 2022-06-02 DIAGNOSIS — Z23 Encounter for immunization: Secondary | ICD-10-CM | POA: Diagnosis not present

## 2022-06-02 DIAGNOSIS — I10 Essential (primary) hypertension: Secondary | ICD-10-CM | POA: Diagnosis not present

## 2022-06-08 DIAGNOSIS — J0141 Acute recurrent pansinusitis: Secondary | ICD-10-CM | POA: Diagnosis not present

## 2022-06-08 DIAGNOSIS — J342 Deviated nasal septum: Secondary | ICD-10-CM | POA: Diagnosis not present

## 2022-07-14 DIAGNOSIS — J0141 Acute recurrent pansinusitis: Secondary | ICD-10-CM | POA: Diagnosis not present

## 2022-07-14 DIAGNOSIS — J342 Deviated nasal septum: Secondary | ICD-10-CM | POA: Diagnosis not present

## 2022-09-15 DIAGNOSIS — L821 Other seborrheic keratosis: Secondary | ICD-10-CM | POA: Diagnosis not present

## 2022-09-15 DIAGNOSIS — D0439 Carcinoma in situ of skin of other parts of face: Secondary | ICD-10-CM | POA: Diagnosis not present

## 2022-09-15 DIAGNOSIS — Z85828 Personal history of other malignant neoplasm of skin: Secondary | ICD-10-CM | POA: Diagnosis not present

## 2022-09-15 DIAGNOSIS — D485 Neoplasm of uncertain behavior of skin: Secondary | ICD-10-CM | POA: Diagnosis not present

## 2022-09-15 DIAGNOSIS — D0472 Carcinoma in situ of skin of left lower limb, including hip: Secondary | ICD-10-CM | POA: Diagnosis not present

## 2022-09-15 DIAGNOSIS — L570 Actinic keratosis: Secondary | ICD-10-CM | POA: Diagnosis not present

## 2022-09-15 DIAGNOSIS — D044 Carcinoma in situ of skin of scalp and neck: Secondary | ICD-10-CM | POA: Diagnosis not present

## 2022-09-15 DIAGNOSIS — D2261 Melanocytic nevi of right upper limb, including shoulder: Secondary | ICD-10-CM | POA: Diagnosis not present

## 2022-09-15 DIAGNOSIS — D225 Melanocytic nevi of trunk: Secondary | ICD-10-CM | POA: Diagnosis not present

## 2022-10-13 DIAGNOSIS — M170 Bilateral primary osteoarthritis of knee: Secondary | ICD-10-CM | POA: Diagnosis not present

## 2022-10-13 DIAGNOSIS — M1712 Unilateral primary osteoarthritis, left knee: Secondary | ICD-10-CM | POA: Diagnosis not present

## 2022-10-13 DIAGNOSIS — M1711 Unilateral primary osteoarthritis, right knee: Secondary | ICD-10-CM | POA: Diagnosis not present

## 2022-11-04 DIAGNOSIS — M25561 Pain in right knee: Secondary | ICD-10-CM | POA: Diagnosis not present

## 2022-11-23 DIAGNOSIS — M1711 Unilateral primary osteoarthritis, right knee: Secondary | ICD-10-CM | POA: Diagnosis not present

## 2022-11-23 DIAGNOSIS — M170 Bilateral primary osteoarthritis of knee: Secondary | ICD-10-CM | POA: Diagnosis not present

## 2022-11-23 DIAGNOSIS — M1712 Unilateral primary osteoarthritis, left knee: Secondary | ICD-10-CM | POA: Diagnosis not present

## 2022-11-30 DIAGNOSIS — M1712 Unilateral primary osteoarthritis, left knee: Secondary | ICD-10-CM | POA: Diagnosis not present

## 2022-11-30 DIAGNOSIS — M1711 Unilateral primary osteoarthritis, right knee: Secondary | ICD-10-CM | POA: Diagnosis not present

## 2022-12-07 DIAGNOSIS — M1711 Unilateral primary osteoarthritis, right knee: Secondary | ICD-10-CM | POA: Diagnosis not present

## 2022-12-07 DIAGNOSIS — M1712 Unilateral primary osteoarthritis, left knee: Secondary | ICD-10-CM | POA: Diagnosis not present

## 2023-03-08 DIAGNOSIS — M25562 Pain in left knee: Secondary | ICD-10-CM | POA: Diagnosis not present

## 2023-03-08 DIAGNOSIS — M25561 Pain in right knee: Secondary | ICD-10-CM | POA: Diagnosis not present

## 2023-03-08 DIAGNOSIS — R269 Unspecified abnormalities of gait and mobility: Secondary | ICD-10-CM | POA: Diagnosis not present

## 2023-03-12 DIAGNOSIS — R269 Unspecified abnormalities of gait and mobility: Secondary | ICD-10-CM | POA: Diagnosis not present

## 2023-03-12 DIAGNOSIS — M25561 Pain in right knee: Secondary | ICD-10-CM | POA: Diagnosis not present

## 2023-03-12 DIAGNOSIS — M25562 Pain in left knee: Secondary | ICD-10-CM | POA: Diagnosis not present

## 2023-03-21 IMAGING — CT CT CARDIAC CORONARY ARTERY CALCIUM SCORE
3 series · 14 of 20 positions shown, 16 images · non-contrast
Comparison: None.
COMPARISON: None.

Addendum:
EXAM:
OVER-READ INTERPRETATION  CT CHEST

The following report is an over-read performed by radiologist Dr.
Yvrose Kiser [[email or web address]] on 05/27/2020. This over-read
does not include interpretation of cardiac or coronary anatomy or
pathology. The coronary calcium score interpretation by the
cardiologist is attached.
CLINICAL DATA: Cardiovascular Disease Risk stratification
Coronary Calcium Score
TECHNIQUE: A gated, non-contrast computed tomography scan of the heart was
performed using 3mm slice thickness. Axial images were analyzed on a
dedicated workstation. Calcium scoring of the coronary arteries was
performed using the Agatston method.

[Series 2: casc 3.0 best diast 71 % (id) · axial · 0.39mm/px · z∈[+1292,+1376]mm · 4 of 48 slices shown]
[im 10/48  vessel]
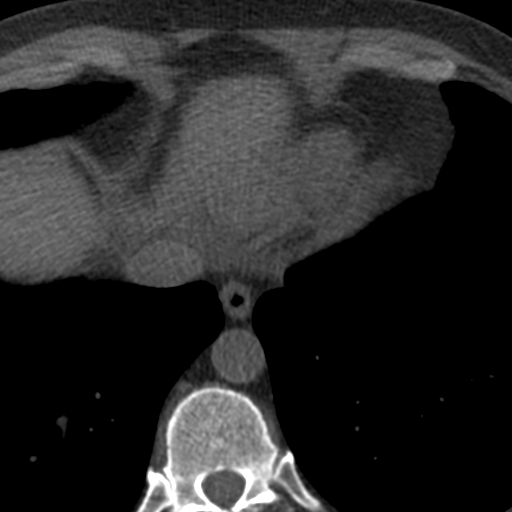
[im 19/48  vessel]
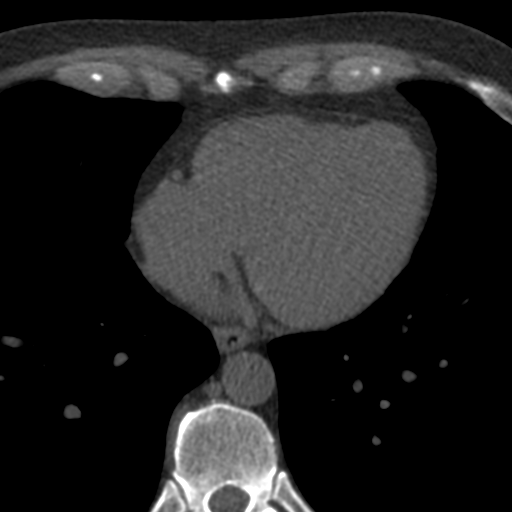
[im 29/48  vessel]
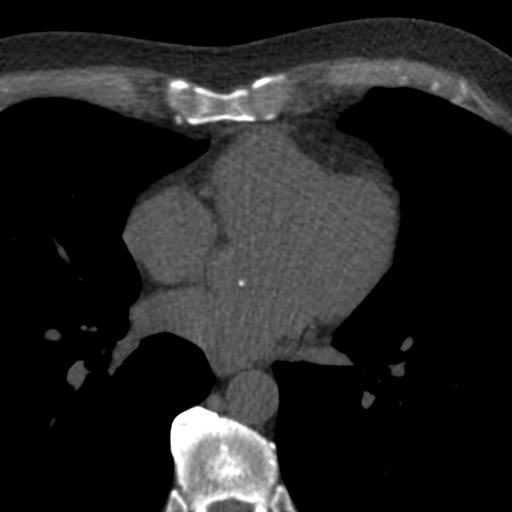
[im 38/48  vessel]
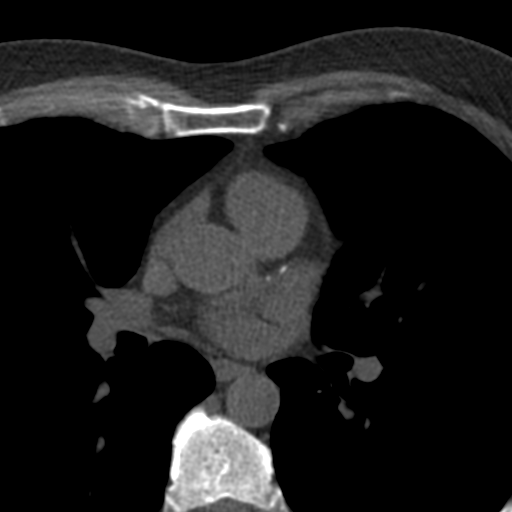

[Series 3: soft full fov 73 % · axial · 0.71mm/px · z∈[+1286,+1382]mm · 5 of 48 slices shown, 7 images]
[im 8/48  vessel]
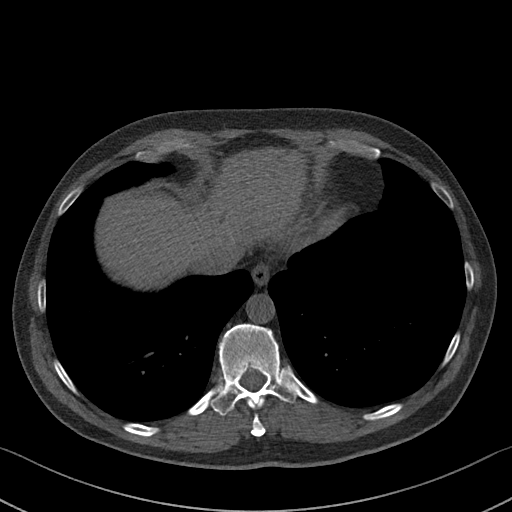
[im 8/48  lung]
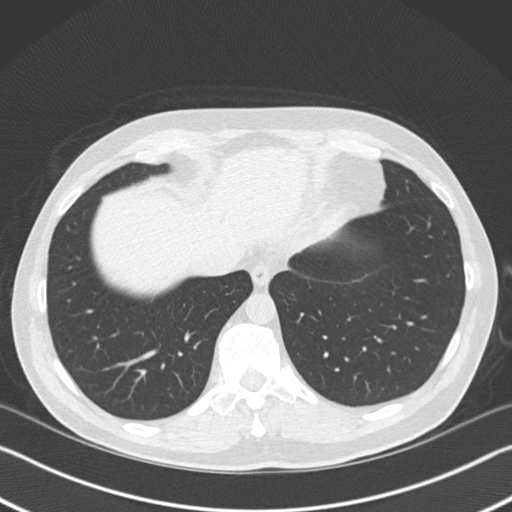
[im 16/48  vessel]
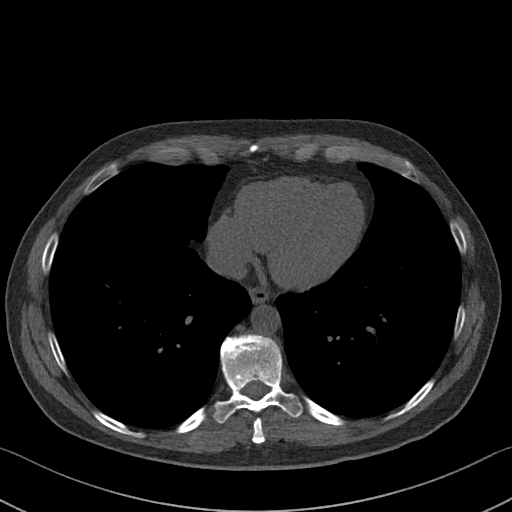
[im 24/48  vessel]
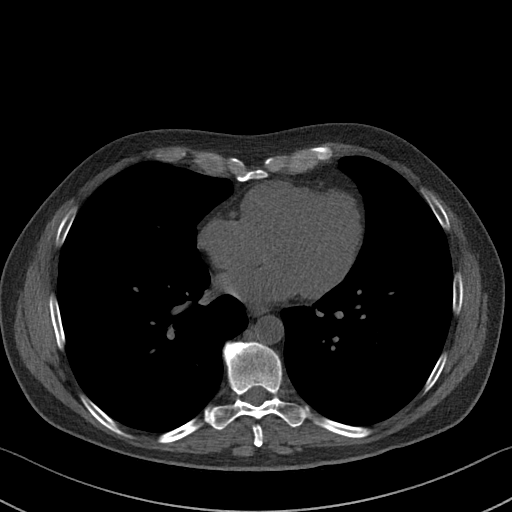
[im 32/48  vessel]
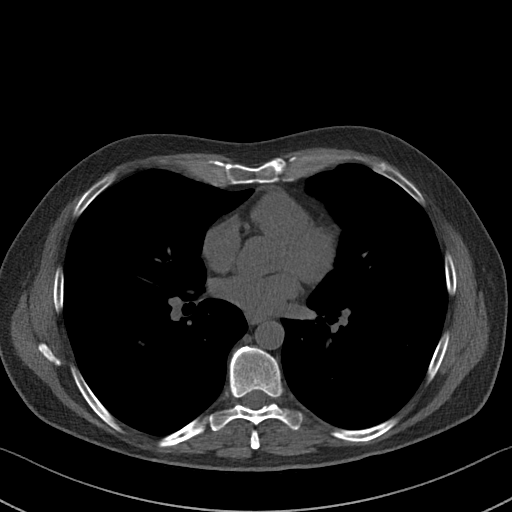
[im 40/48  vessel]
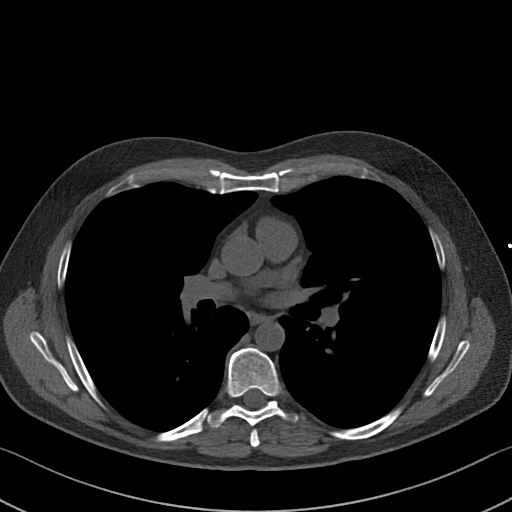
[im 40/48  lung]
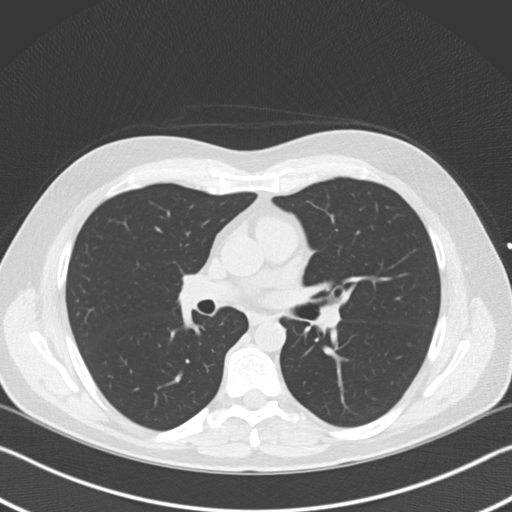

[Series 4: lungs 73 % · axial · 0.71mm/px · z∈[+1286,+1382]mm · 5 of 48 slices shown]
[im 8/48  vessel]
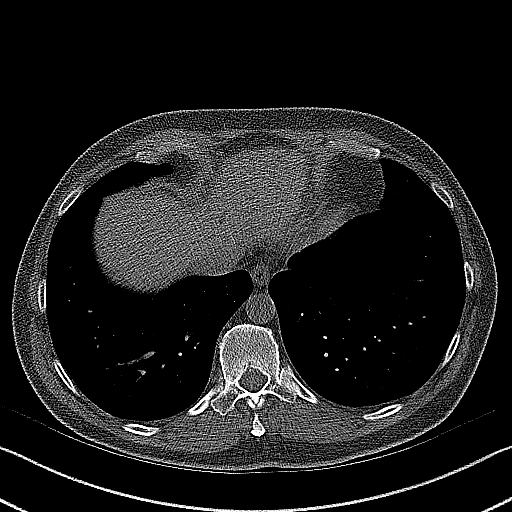
[im 16/48  vessel]
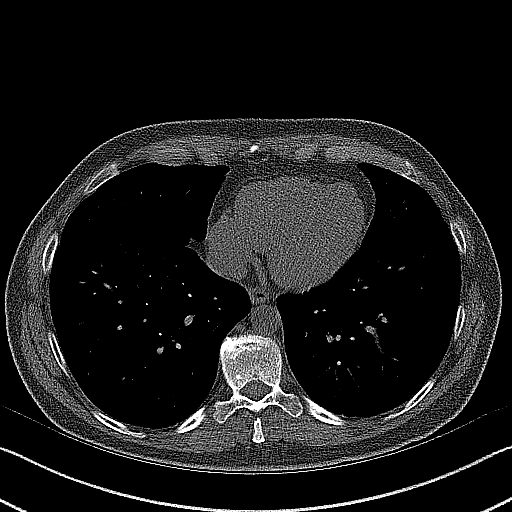
[im 24/48  vessel]
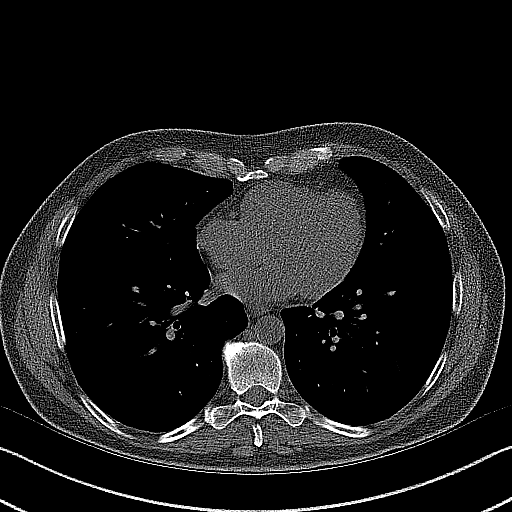
[im 32/48  vessel]
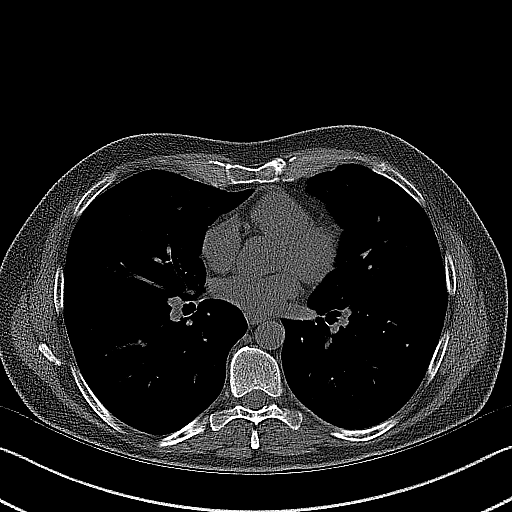
[im 40/48  vessel]
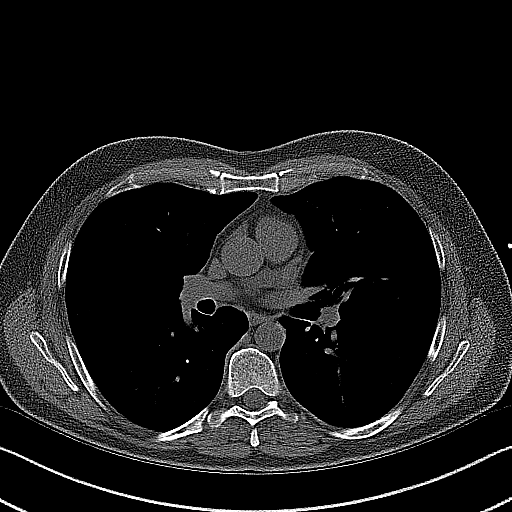

[14 of 20 positions shown; findings below may reference images not displayed]

FINDINGS: Vascular: Few scattered calcifications in the aortic root. Heart is
normal size. Aorta normal caliber.

Mediastinum/Nodes: No adenopathy

Lungs/Pleura: No confluent opacities or effusions.

Upper Abdomen: Numerous low-density lesions seen within the
visualized liver, likely cysts although these cannot be fully
characterized on this noncontrast study.

Musculoskeletal: Chest wall soft tissues are unremarkable. No acute
bony abnormality
IMPRESSION: Aortic atherosclerosis in the aortic root.

Scattered low-density lesions within the visualized liver which
cannot be characterized fully on this noncontrast study. These could
be further characterized with ultrasound.
FINDINGS: Coronary Calcium Score:

Left main:

Left anterior descending artery:

Left circumflex artery:

Right coronary artery: 0

Total: 111

Percentile: 69th

Pericardium: Normal.

Ascending Aorta: Normal caliber.

Non-cardiac: See separate report from [REDACTED].
IMPRESSION: Coronary calcium score of 111. This was 69th percentile for age-,
race-, and sex-matched controls.



If CAC=0, it is reasonable to withhold statin therapy and reassess
in 5 to 10 years, as long as higher risk conditions are absent
(diabetes mellitus, family history of premature CHD in first degree
relatives (males <55 years; females <65 years), cigarette smoking,
or LDL >=190 mg/dL).

If CAC is 1 to 99, it is reasonable to initiate statin therapy for
patients >=55 years of age.

If CAC is >=100 or >=75th percentile, it is reasonable to initiate
statin therapy at any age.

Cardiology referral should be considered for patients with CAC
scores >=400 or >=75th percentile.

*2843 AHA/ACC/AACVPR/AAPA/ABC/SOFIE MARIE/QUIRIJN/RATROUTE/[place]ICKIS/JASVINDER/SOLANO MASACHE
Guideline on the Management of Blood Cholesterol: A Report of the
American College of Cardiology/American Heart Association Task Force
on Clinical Practice Guidelines. J Am Coll Cardiol.
2046;73(24):7919-7819.

*** End of Addendum ***
EXAM:
OVER-READ INTERPRETATION  CT CHEST

The following report is an over-read performed by radiologist Dr.
Yvrose Kiser [[email or web address]] on 05/27/2020. This over-read
does not include interpretation of cardiac or coronary anatomy or
pathology. The coronary calcium score interpretation by the
cardiologist is attached.
FINDINGS: Vascular: Few scattered calcifications in the aortic root. Heart is
normal size. Aorta normal caliber.

Mediastinum/Nodes: No adenopathy

Lungs/Pleura: No confluent opacities or effusions.

Upper Abdomen: Numerous low-density lesions seen within the
visualized liver, likely cysts although these cannot be fully
characterized on this noncontrast study.

Musculoskeletal: Chest wall soft tissues are unremarkable. No acute
bony abnormality
IMPRESSION: Aortic atherosclerosis in the aortic root.

Scattered low-density lesions within the visualized liver which
cannot be characterized fully on this noncontrast study. These could
be further characterized with ultrasound.

## 2023-03-22 DIAGNOSIS — M25561 Pain in right knee: Secondary | ICD-10-CM | POA: Diagnosis not present

## 2023-03-22 DIAGNOSIS — M25562 Pain in left knee: Secondary | ICD-10-CM | POA: Diagnosis not present

## 2023-03-22 DIAGNOSIS — R269 Unspecified abnormalities of gait and mobility: Secondary | ICD-10-CM | POA: Diagnosis not present

## 2023-03-22 DIAGNOSIS — Z85828 Personal history of other malignant neoplasm of skin: Secondary | ICD-10-CM | POA: Diagnosis not present

## 2023-03-22 DIAGNOSIS — L821 Other seborrheic keratosis: Secondary | ICD-10-CM | POA: Diagnosis not present

## 2023-03-22 DIAGNOSIS — L570 Actinic keratosis: Secondary | ICD-10-CM | POA: Diagnosis not present

## 2023-03-29 DIAGNOSIS — R269 Unspecified abnormalities of gait and mobility: Secondary | ICD-10-CM | POA: Diagnosis not present

## 2023-03-29 DIAGNOSIS — M25561 Pain in right knee: Secondary | ICD-10-CM | POA: Diagnosis not present

## 2023-03-29 DIAGNOSIS — M25562 Pain in left knee: Secondary | ICD-10-CM | POA: Diagnosis not present

## 2023-04-05 DIAGNOSIS — R269 Unspecified abnormalities of gait and mobility: Secondary | ICD-10-CM | POA: Diagnosis not present

## 2023-04-05 DIAGNOSIS — M25562 Pain in left knee: Secondary | ICD-10-CM | POA: Diagnosis not present

## 2023-04-05 DIAGNOSIS — M25561 Pain in right knee: Secondary | ICD-10-CM | POA: Diagnosis not present

## 2023-04-12 DIAGNOSIS — M25561 Pain in right knee: Secondary | ICD-10-CM | POA: Diagnosis not present

## 2023-04-12 DIAGNOSIS — M170 Bilateral primary osteoarthritis of knee: Secondary | ICD-10-CM | POA: Diagnosis not present

## 2023-04-12 DIAGNOSIS — M25562 Pain in left knee: Secondary | ICD-10-CM | POA: Diagnosis not present

## 2023-04-12 DIAGNOSIS — M1711 Unilateral primary osteoarthritis, right knee: Secondary | ICD-10-CM | POA: Diagnosis not present

## 2023-04-12 DIAGNOSIS — M1712 Unilateral primary osteoarthritis, left knee: Secondary | ICD-10-CM | POA: Diagnosis not present

## 2023-04-12 DIAGNOSIS — R269 Unspecified abnormalities of gait and mobility: Secondary | ICD-10-CM | POA: Diagnosis not present

## 2023-04-16 DIAGNOSIS — C61 Malignant neoplasm of prostate: Secondary | ICD-10-CM | POA: Diagnosis not present

## 2023-04-23 DIAGNOSIS — C61 Malignant neoplasm of prostate: Secondary | ICD-10-CM | POA: Diagnosis not present

## 2023-04-26 DIAGNOSIS — M25562 Pain in left knee: Secondary | ICD-10-CM | POA: Diagnosis not present

## 2023-04-26 DIAGNOSIS — R269 Unspecified abnormalities of gait and mobility: Secondary | ICD-10-CM | POA: Diagnosis not present

## 2023-04-26 DIAGNOSIS — M25561 Pain in right knee: Secondary | ICD-10-CM | POA: Diagnosis not present

## 2023-05-03 DIAGNOSIS — R269 Unspecified abnormalities of gait and mobility: Secondary | ICD-10-CM | POA: Diagnosis not present

## 2023-05-03 DIAGNOSIS — H01001 Unspecified blepharitis right upper eyelid: Secondary | ICD-10-CM | POA: Diagnosis not present

## 2023-05-03 DIAGNOSIS — M25561 Pain in right knee: Secondary | ICD-10-CM | POA: Diagnosis not present

## 2023-05-03 DIAGNOSIS — M25562 Pain in left knee: Secondary | ICD-10-CM | POA: Diagnosis not present

## 2023-05-03 DIAGNOSIS — H01004 Unspecified blepharitis left upper eyelid: Secondary | ICD-10-CM | POA: Diagnosis not present

## 2023-05-03 DIAGNOSIS — B880 Other acariasis: Secondary | ICD-10-CM | POA: Diagnosis not present

## 2023-05-10 DIAGNOSIS — M25562 Pain in left knee: Secondary | ICD-10-CM | POA: Diagnosis not present

## 2023-05-10 DIAGNOSIS — R269 Unspecified abnormalities of gait and mobility: Secondary | ICD-10-CM | POA: Diagnosis not present

## 2023-05-10 DIAGNOSIS — F900 Attention-deficit hyperactivity disorder, predominantly inattentive type: Secondary | ICD-10-CM | POA: Diagnosis not present

## 2023-05-10 DIAGNOSIS — I10 Essential (primary) hypertension: Secondary | ICD-10-CM | POA: Diagnosis not present

## 2023-05-10 DIAGNOSIS — E7800 Pure hypercholesterolemia, unspecified: Secondary | ICD-10-CM | POA: Diagnosis not present

## 2023-05-10 DIAGNOSIS — Z79899 Other long term (current) drug therapy: Secondary | ICD-10-CM | POA: Diagnosis not present

## 2023-05-10 DIAGNOSIS — M25561 Pain in right knee: Secondary | ICD-10-CM | POA: Diagnosis not present

## 2023-05-10 DIAGNOSIS — E559 Vitamin D deficiency, unspecified: Secondary | ICD-10-CM | POA: Diagnosis not present

## 2023-05-10 DIAGNOSIS — K219 Gastro-esophageal reflux disease without esophagitis: Secondary | ICD-10-CM | POA: Diagnosis not present

## 2023-05-10 DIAGNOSIS — Z0000 Encounter for general adult medical examination without abnormal findings: Secondary | ICD-10-CM | POA: Diagnosis not present

## 2023-05-21 DIAGNOSIS — M25561 Pain in right knee: Secondary | ICD-10-CM | POA: Diagnosis not present

## 2023-05-21 DIAGNOSIS — R269 Unspecified abnormalities of gait and mobility: Secondary | ICD-10-CM | POA: Diagnosis not present

## 2023-05-21 DIAGNOSIS — M25562 Pain in left knee: Secondary | ICD-10-CM | POA: Diagnosis not present

## 2023-06-28 DIAGNOSIS — M25462 Effusion, left knee: Secondary | ICD-10-CM | POA: Diagnosis not present

## 2023-06-28 DIAGNOSIS — M25461 Effusion, right knee: Secondary | ICD-10-CM | POA: Diagnosis not present

## 2023-07-01 DIAGNOSIS — M25562 Pain in left knee: Secondary | ICD-10-CM | POA: Diagnosis not present

## 2023-07-01 DIAGNOSIS — R269 Unspecified abnormalities of gait and mobility: Secondary | ICD-10-CM | POA: Diagnosis not present

## 2023-07-01 DIAGNOSIS — M25561 Pain in right knee: Secondary | ICD-10-CM | POA: Diagnosis not present

## 2023-07-19 DIAGNOSIS — R269 Unspecified abnormalities of gait and mobility: Secondary | ICD-10-CM | POA: Diagnosis not present

## 2023-07-19 DIAGNOSIS — M25561 Pain in right knee: Secondary | ICD-10-CM | POA: Diagnosis not present

## 2023-07-19 DIAGNOSIS — M25562 Pain in left knee: Secondary | ICD-10-CM | POA: Diagnosis not present

## 2023-08-30 DIAGNOSIS — H01004 Unspecified blepharitis left upper eyelid: Secondary | ICD-10-CM | POA: Diagnosis not present

## 2023-08-30 DIAGNOSIS — H0288A Meibomian gland dysfunction right eye, upper and lower eyelids: Secondary | ICD-10-CM | POA: Diagnosis not present

## 2023-08-30 DIAGNOSIS — H0288B Meibomian gland dysfunction left eye, upper and lower eyelids: Secondary | ICD-10-CM | POA: Diagnosis not present

## 2023-08-30 DIAGNOSIS — H16223 Keratoconjunctivitis sicca, not specified as Sjogren's, bilateral: Secondary | ICD-10-CM | POA: Diagnosis not present

## 2023-08-30 DIAGNOSIS — B880 Other acariasis: Secondary | ICD-10-CM | POA: Diagnosis not present

## 2023-08-30 DIAGNOSIS — H01001 Unspecified blepharitis right upper eyelid: Secondary | ICD-10-CM | POA: Diagnosis not present

## 2023-09-20 DIAGNOSIS — B079 Viral wart, unspecified: Secondary | ICD-10-CM | POA: Diagnosis not present

## 2023-09-20 DIAGNOSIS — Z85828 Personal history of other malignant neoplasm of skin: Secondary | ICD-10-CM | POA: Diagnosis not present

## 2023-09-20 DIAGNOSIS — L309 Dermatitis, unspecified: Secondary | ICD-10-CM | POA: Diagnosis not present

## 2023-09-20 DIAGNOSIS — L570 Actinic keratosis: Secondary | ICD-10-CM | POA: Diagnosis not present

## 2023-09-20 DIAGNOSIS — L821 Other seborrheic keratosis: Secondary | ICD-10-CM | POA: Diagnosis not present

## 2023-09-20 DIAGNOSIS — D485 Neoplasm of uncertain behavior of skin: Secondary | ICD-10-CM | POA: Diagnosis not present

## 2023-09-20 DIAGNOSIS — D225 Melanocytic nevi of trunk: Secondary | ICD-10-CM | POA: Diagnosis not present

## 2023-10-04 DIAGNOSIS — M1711 Unilateral primary osteoarthritis, right knee: Secondary | ICD-10-CM | POA: Diagnosis not present

## 2023-10-04 DIAGNOSIS — M170 Bilateral primary osteoarthritis of knee: Secondary | ICD-10-CM | POA: Diagnosis not present

## 2023-10-04 DIAGNOSIS — M1712 Unilateral primary osteoarthritis, left knee: Secondary | ICD-10-CM | POA: Diagnosis not present

## 2023-11-18 DIAGNOSIS — H16223 Keratoconjunctivitis sicca, not specified as Sjogren's, bilateral: Secondary | ICD-10-CM | POA: Diagnosis not present

## 2023-11-18 DIAGNOSIS — H0288A Meibomian gland dysfunction right eye, upper and lower eyelids: Secondary | ICD-10-CM | POA: Diagnosis not present

## 2023-11-18 DIAGNOSIS — H0288B Meibomian gland dysfunction left eye, upper and lower eyelids: Secondary | ICD-10-CM | POA: Diagnosis not present

## 2023-11-26 DIAGNOSIS — M170 Bilateral primary osteoarthritis of knee: Secondary | ICD-10-CM | POA: Diagnosis not present
